# Patient Record
Sex: Male | Born: 1960 | Race: White | Hispanic: No | Smoking: Never smoker
Health system: Southern US, Community
[De-identification: ages and names within clinical notes are randomized; demographics above are authoritative.]

## PROBLEM LIST (undated history)

## (undated) DIAGNOSIS — T7840XA Allergy, unspecified, initial encounter: Secondary | ICD-10-CM

## (undated) DIAGNOSIS — R9430 Abnormal result of cardiovascular function study, unspecified: Secondary | ICD-10-CM

## (undated) DIAGNOSIS — I480 Paroxysmal atrial fibrillation: Secondary | ICD-10-CM

## (undated) HISTORY — DX: Allergy, unspecified, initial encounter: T78.40XA

## (undated) HISTORY — DX: Abnormal result of cardiovascular function study, unspecified: R94.30

---

## 1999-09-06 ENCOUNTER — Emergency Department (HOSPITAL_COMMUNITY): Admission: EM | Admit: 1999-09-06 | Discharge: 1999-09-06 | Payer: Self-pay | Admitting: Emergency Medicine

## 2012-03-18 ENCOUNTER — Ambulatory Visit: Payer: Self-pay | Admitting: Family Medicine

## 2012-03-18 VITALS — BP 128/76 | HR 92 | Temp 98.3°F | Resp 16 | Ht 72.5 in | Wt 183.0 lb

## 2012-03-18 DIAGNOSIS — Z0000 Encounter for general adult medical examination without abnormal findings: Secondary | ICD-10-CM

## 2012-03-18 DIAGNOSIS — M549 Dorsalgia, unspecified: Secondary | ICD-10-CM

## 2012-03-18 DIAGNOSIS — M545 Low back pain, unspecified: Secondary | ICD-10-CM

## 2012-03-18 DIAGNOSIS — J019 Acute sinusitis, unspecified: Secondary | ICD-10-CM

## 2012-03-18 DIAGNOSIS — J329 Chronic sinusitis, unspecified: Secondary | ICD-10-CM

## 2012-03-18 MED ORDER — AMOXICILLIN 875 MG PO TABS: 875.0000 mg | ORAL_TABLET | Freq: Two times a day (BID) | ORAL | Status: DC

## 2012-03-18 NOTE — Patient Instructions (Signed)
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

## 2012-03-18 NOTE — Progress Notes (Signed)
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  He has stretching exercises.  Doing saline irrigations.  The drainage is light green.  He has a h/o sinus polyp.  Objective:  NAD Nose:  Erythematous with some edema Oroph:  Clear TM's:  Normal Neck: supple  Back: nontender, no scoliosis seen  Assessment:  Sinus infection, chronic LBP  Plan: Refer to [employer]

## 2012-03-24 ENCOUNTER — Telehealth: Payer: Self-pay

## 2012-03-24 NOTE — Telephone Encounter (Signed)
[facility] STATES DR KURT REFERRED PT AND HE WILL BE COMING TO SEE THEM THIS AFTERNOON AND THEY HAVE NO RECORDS PLEASE FAX TO [phone] AND YOU MAY REACH THEM AT [phone]

## 2012-09-04 ENCOUNTER — Ambulatory Visit: Payer: Self-pay | Admitting: Emergency Medicine

## 2012-09-04 VITALS — BP 118/72 | HR 78 | Temp 98.4°F | Resp 16 | Ht 73.25 in | Wt 186.6 lb

## 2012-09-04 DIAGNOSIS — M7552 Bursitis of left shoulder: Secondary | ICD-10-CM

## 2012-09-04 DIAGNOSIS — M719 Bursopathy, unspecified: Secondary | ICD-10-CM

## 2012-09-04 MED ORDER — NAPROXEN SODIUM 550 MG PO TABS: 550.0000 mg | ORAL_TABLET | Freq: Two times a day (BID) | ORAL | Status: AC

## 2012-09-04 MED ORDER — METHYLPREDNISOLONE ACETATE 80 MG/ML IJ SUSP: 80.0000 mg | Freq: Once | INTRAMUSCULAR | Status: DC

## 2012-09-04 NOTE — Progress Notes (Signed)
[facility] [address] [place] [address] [phone]  Date:  09/04/2012   Name:  Luke Benitez   DOB:  19-Dec-1960   MRN:  [id number]  PCP:  No primary provider on file.    Chief Complaint: Shoulder Pain   History of Present Illness:  Herny Scurlock is a 52 y.o. very pleasant male patient who presents with the following:  Overuse injury from one week ago while weight lifting.  No direct injury.  Has pain in lateral shoulder posteriorly.  No crepitus.  No erythema.  Pain worst yesterday.  Worse with movement.  No improvement with over the counter medications or other home remedies. Denies other complaint or health concern today.   There are no active problems to display for this patient.   Past Medical History  Diagnosis Date  . Allergy     No past surgical history on file.  History  Substance Use Topics  . Smoking status: Never Smoker   . Smokeless tobacco: Not on file  . Alcohol Use: 2.4 oz/week    4 Cans of beer per week    Family History  Problem Relation Age of Onset  . Cancer Mother   . Cancer Father   . Stroke Maternal Grandmother   . Emphysema Maternal Grandfather   . Heart attack Paternal Grandfather     No Known Allergies  Medication list has been reviewed and updated.  Current Outpatient Prescriptions on File Prior to Visit  Medication Sig Dispense Refill  . amoxicillin (AMOXIL) 875 MG tablet Take 1 tablet (875 mg total) by mouth 2 (two) times daily.  20 tablet  0   No current facility-administered medications on file prior to visit.    Review of Systems:  As per HPI, otherwise negative.    Physical Examination: Filed Vitals:   09/04/12 1609  BP: 118/72  Pulse: 78  Temp: 98.4 F (36.9 C)  Resp: 16   Filed Vitals:   09/04/12 1609  Height: 6' 1.25" (1.861 m)  Weight: 186 lb 9.6 oz (84.641 kg)   Body mass index is 24.44 kg/(m^2). Ideal Body Weight: Weight in (lb) to have BMI = 25: 190.4   GEN: WDWN,  NAD, Non-toxic, Alert & Oriented x 3 HEENT: Atraumatic, Normocephalic.  Ears and Nose: No external deformity. EXTR: No clubbing/cyanosis/edema NEURO: Normal gait.  PSYCH: Normally interactive. Conversant. Not depressed or anxious appearing.  Calm demeanor.  SHOULDER:  Left posterior shoulder tender with no crepitus.  Full AROM  Assessment and Plan: Shoulder bursitis Anaprox Depo medrol   Signed,  Phillips Odor, MD

## 2012-09-04 NOTE — Patient Instructions (Addendum)
Bursitis Bursitis is a swelling and soreness (inflammation) of a fluid-filled sac (bursa) that overlies and protects a joint. It can be caused by injury, overuse of the joint, arthritis or infection. The joints most likely to be affected are the elbows, shoulders, hips and knees. HOME CARE INSTRUCTIONS   Apply ice to the affected area for 15 to 20 minutes each hour while awake for 2 days. Put the ice in a plastic bag and place a towel between the bag of ice and your skin.  Rest the injured joint as much as possible, but continue to put the joint through a full range of motion, 4 times per day. (The shoulder joint especially becomes rapidly "frozen" if not used.) When the pain lessens, begin normal slow movements and usual activities.  Only take over-the-counter or prescription medicines for pain, discomfort or fever as directed by your caregiver.  Your caregiver may recommend draining the bursa and injecting medicine into the bursa. This may help the healing process.  Follow all instructions for follow-up with your caregiver. This includes any orthopedic referrals, physical therapy and rehabilitation. Any delay in obtaining necessary care could result in a delay or failure of the bursitis to heal and chronic pain. SEEK IMMEDIATE MEDICAL CARE IF:   Your pain increases even during treatment.  You develop an oral temperature above 102 F (38.9 C) and have heat and inflammation over the involved bursa. MAKE SURE YOU:   Understand these instructions.  Will watch your condition.  Will get help right away if you are not doing well or get worse. Document Released: 04/06/2000 Document Revised: 07/02/2011 Document Reviewed: 03/11/2009 ExitCare Patient Information 2013 [employer].  

## 2014-03-12 ENCOUNTER — Encounter (HOSPITAL_COMMUNITY): Payer: Self-pay | Admitting: Emergency Medicine

## 2014-03-12 ENCOUNTER — Emergency Department (HOSPITAL_COMMUNITY)
Admission: EM | Admit: 2014-03-12 | Discharge: 2014-03-12 | Disposition: A | Discharge status: Home / Self Care | Payer: Self-pay | Attending: Emergency Medicine | Admitting: Emergency Medicine

## 2014-03-12 ENCOUNTER — Telehealth (HOSPITAL_BASED_OUTPATIENT_CLINIC_OR_DEPARTMENT_OTHER): Payer: Self-pay | Admitting: Emergency Medicine

## 2014-03-12 DIAGNOSIS — Z792 Long term (current) use of antibiotics: Secondary | ICD-10-CM | POA: Insufficient documentation

## 2014-03-12 DIAGNOSIS — I359 Nonrheumatic aortic valve disorder, unspecified: Secondary | ICD-10-CM

## 2014-03-12 DIAGNOSIS — I480 Paroxysmal atrial fibrillation: Secondary | ICD-10-CM | POA: Diagnosis present

## 2014-03-12 DIAGNOSIS — I4891 Unspecified atrial fibrillation: Secondary | ICD-10-CM | POA: Insufficient documentation

## 2014-03-12 LAB — CBC WITH DIFFERENTIAL/PLATELET
BASOS PCT: 0 % (ref 0–1)
Basophils Absolute: 0 10*3/uL (ref 0.0–0.1)
EOS PCT: 1 % (ref 0–5)
Eosinophils Absolute: 0.1 10*3/uL (ref 0.0–0.7)
HCT: 42.3 % (ref 39.0–52.0)
Hemoglobin: 14.4 g/dL (ref 13.0–17.0)
Lymphocytes Relative: 42 % (ref 12–46)
Lymphs Abs: 2.9 10*3/uL (ref 0.7–4.0)
MCH: 30.3 pg (ref 26.0–34.0)
MCHC: 34 g/dL (ref 30.0–36.0)
MCV: 88.9 fL (ref 78.0–100.0)
Monocytes Absolute: 0.6 10*3/uL (ref 0.1–1.0)
Monocytes Relative: 9 % (ref 3–12)
Neutro Abs: 3.3 10*3/uL (ref 1.7–7.7)
Neutrophils Relative %: 48 % (ref 43–77)
Platelets: 314 10*3/uL (ref 150–400)
RBC: 4.76 MIL/uL (ref 4.22–5.81)
RDW: 13 % (ref 11.5–15.5)
WBC: 6.8 10*3/uL (ref 4.0–10.5)

## 2014-03-12 LAB — BASIC METABOLIC PANEL
Anion gap: 17 — ABNORMAL HIGH (ref 5–15)
BUN: 16 mg/dL (ref 6–23)
CALCIUM: 9.1 mg/dL (ref 8.4–10.5)
CO2: 22 mEq/L (ref 19–32)
Chloride: 100 mEq/L (ref 96–112)
Creatinine, Ser: 0.95 mg/dL (ref 0.50–1.35)
GFR calc Af Amer: >90 mL/min (ref >90)
GLUCOSE: 153 mg/dL — AB (ref 70–99)
Potassium: 3.9 mEq/L (ref 3.7–5.3)
SODIUM: 139 meq/L (ref 137–147)

## 2014-03-12 MED ORDER — RIVAROXABAN 20 MG PO TABS: 20.0000 mg | ORAL_TABLET | Freq: Every day | ORAL | Status: DC

## 2014-03-12 MED ORDER — DILTIAZEM HCL 100 MG IV SOLR
5.0000 mg/h | Freq: Once | INTRAVENOUS | Status: AC
  Administered 2014-03-12: 5 mg/h via INTRAVENOUS
  Administered 2014-03-12: 15 mg/h via INTRAVENOUS

## 2014-03-12 MED ORDER — DILTIAZEM HCL 60 MG PO TABS
60.0000 mg | ORAL_TABLET | Freq: Four times a day (QID) | ORAL | Status: AC
  Administered 2014-03-12: 60 mg via ORAL
  Filled 2014-03-12: qty 1

## 2014-03-12 MED ORDER — RIVAROXABAN 20 MG PO TABS
20.0000 mg | ORAL_TABLET | Freq: Once | ORAL | Status: AC
  Administered 2014-03-12: 20 mg via ORAL
  Filled 2014-03-12: qty 1

## 2014-03-12 MED ORDER — DILTIAZEM HCL ER COATED BEADS 180 MG PO TB24: 180.0000 mg | ORAL_TABLET | Freq: Every day | ORAL | Status: DC

## 2014-03-12 NOTE — Discharge Instructions (Signed)

## 2014-03-12 NOTE — ED Notes (Signed)
Pt converted to NSR HR 70

## 2014-03-12 NOTE — Progress Notes (Signed)
  CARE MANAGEMENT ED NOTE 03/12/2014  Patient:  Luke Benitez,Luke Benitez   Account Number:  [id number]  Date Initiated:  03/12/2014  Documentation initiated by:  [facility]Kyriana Yankee  Subjective/Objective Assessment:     Subjective/Objective Assessment Detail:   [facility] Complains of palpitations onset 1 PM today Felt his rapid heartbeat. No other associated symptoms no chest pain no shortness of breath no nausea or vomiting.     Action/Plan:   Medications assistance:  Xarelto 30 day savings Card  Drug coupon for Cardizem   Action/Plan Detail:   Anticipated DC Date:  03/12/2014     Status Recommendation to Physician:   Result of Recommendation:  Agreed    DC Planning Services  Medication Assistance    Choice offered to / List presented to:  C-1 Patient          Status of service:  Completed, signed off  ED Comments:   ED Comments Detail:  ED CM received call from patient today regarding medication assistance for xarelto and cardizem. Explained that we could assist him with a 30 free xarelto savings card, and cardizem coupon. Patient was agreeable. Offered to assist with f/u care. patient states he has a cardiologist that he will f/u with on Monday. Patient agrees to come back to the ED to pick up savings card and coupon. No further CM needs identified.

## 2014-03-12 NOTE — ED Notes (Signed)
Patient transported to echo ?

## 2014-03-12 NOTE — ED Notes (Signed)
Approximately 1 hour ago patient reports he bean having heart palpitations. Patient was experiencing mild SOB and anxiety with symptoms. EMS arrived and patient was in a-fib at 120-160bpm. EMS gave 20 of Cardizem and patients rate is now 90-120bpm. Denies CP, no prior hx of a-fib.

## 2014-03-12 NOTE — ED Notes (Signed)
Cardiology at bedside.

## 2014-03-12 NOTE — ED Provider Notes (Addendum)
CSN: [id number]     Arrival date & time 03/12/14  0158 History   First MD Initiated Contact with Patient 03/12/14 0251     Chief Complaint  Patient presents with  . Atrial Fibrillation     (Consider location/radiation/quality/duration/timing/severity/associated sxs/prior Treatment) HPI Complains of palpitations onset 1 PM today Felt his rapid heartbeat. No other associated symptoms no chest pain no shortness of breath no nausea or vomiting. Patient brought by EMS treated with Cardizem 20 g IV prior to arrival which temporary slowed his heart beat. He is found by EMS to be in atrial fibrillation with rapid ventricular response. He admits to drinking 4 beers earlier tonight. He normally drinks 1-2 beers daily. Past Medical History  Diagnosis Date  . Allergy    History reviewed. No pertinent past surgical history. Family History  Problem Relation Age of Onset  . Cancer Mother   . Cancer Father   . Stroke Maternal Grandmother   . Emphysema Maternal Grandfather   . Heart attack Paternal Grandfather    History  Substance Use Topics  . Smoking status: Never Smoker   . Smokeless tobacco: Not on file  . Alcohol Use: 2.4 oz/week    4 Cans of beer per week    Review of Systems  Constitutional: Negative.   HENT: Negative.   Respiratory: Negative.   Cardiovascular: Positive for palpitations.  Gastrointestinal: Negative.   Musculoskeletal: Negative.   Skin: Negative.   Neurological: Negative.   Psychiatric/Behavioral: Negative.   All other systems reviewed and are negative.     Allergies  Review of patient's allergies indicates no known allergies.  Home Medications   Prior to Admission medications   Medication Sig Start Date End Date Taking? Authorizing Provider  amoxicillin (AMOXIL) 875 MG tablet Take 1 tablet (875 mg total) by mouth 2 (two) times daily. 03/18/12   Elvina SidleKurt Lauenstein, MD   BP 115/67 mmHg  Pulse 109  Temp(Src) 98 F (36.7 C) (Oral)  Resp 11  SpO2  95% Physical Exam  Constitutional: He appears well-developed and well-nourished.  HENT:  Head: Normocephalic and atraumatic.  Eyes: Conjunctivae are normal. Pupils are equal, round, and reactive to light.  Neck: Neck supple. No tracheal deviation present. No thyromegaly present.  Cardiovascular:  No murmur heard. Tachycardic irregularly irregular  Pulmonary/Chest: Effort normal and breath sounds normal.  Abdominal: Soft. Bowel sounds are normal. He exhibits no distension. There is no tenderness.  Musculoskeletal: Normal range of motion. He exhibits no edema or tenderness.  Neurological: He is alert. Coordination normal.  Skin: Skin is warm and dry. No rash noted.  Psychiatric: He has a normal mood and affect.  Nursing note and vitals reviewed.   ED Course  Procedures (including critical care time) Labs Review Labs Reviewed - No data to display  Imaging Review No results found.   EKG Interpretation None      Date: 03/12/2014  Rate: 125  Rhythm: atrial fibrillation  QRS Axis: left  Intervals: normal  ST/T Wave abnormalities: nonspecific T wave changes  Conduction Disutrbances:left anterior fascicular block  Narrative Interpretation:   Old EKG Reviewed: none available 6 AM patient resting comfortably after treatment with intravenous Cardizem drip. He is now rate controlled Results for orders placed or performed during the hospital encounter of 03/12/14  Basic metabolic panel  Result Value Ref Range   Sodium 139 137 - 147 mEq/L   Potassium 3.9 3.7 - 5.3 mEq/L   Chloride 100 96 - 112 mEq/L   CO2 22 19 -  32 mEq/L   Glucose, Bld 153 (H) 70 - 99 mg/dL   BUN 16 6 - 23 mg/dL   Creatinine, Ser 1.610.95 0.50 - 1.35 mg/dL   Calcium 9.1 8.4 - 09.610.5 mg/dL   GFR calc non Af Amer >90 >90 mL/min   GFR calc Af Amer >90 >90 mL/min   Anion gap 17 (H) 5 - 15  CBC with Differential  Result Value Ref Range   WBC 6.8 4.0 - 10.5 K/uL   RBC 4.76 4.22 - 5.81 MIL/uL   Hemoglobin 14.4 13.0 -  17.0 g/dL   HCT 04.542.3 40.939.0 - 81.152.0 %   MCV 88.9 78.0 - 100.0 fL   MCH 30.3 26.0 - 34.0 pg   MCHC 34.0 30.0 - 36.0 g/dL   RDW 91.413.0 78.211.5 - 95.615.5 %   Platelets 314 150 - 400 K/uL   Neutrophils Relative % 48 43 - 77 %   Neutro Abs 3.3 1.7 - 7.7 K/uL   Lymphocytes Relative 42 12 - 46 %   Lymphs Abs 2.9 0.7 - 4.0 K/uL   Monocytes Relative 9 3 - 12 %   Monocytes Absolute 0.6 0.1 - 1.0 K/uL   Eosinophils Relative 1 0 - 5 %   Eosinophils Absolute 0.1 0.0 - 0.7 K/uL   Basophils Relative 0 0 - 1 %   Basophils Absolute 0.0 0.0 - 0.1 K/uL   No results found.  MDM  Spoke with cardiologist on call. Echocardiogram ordered at his request. Cardiology service will come to evaluate patientin the ed . Patient will need out patient follow-up and referral to primary care physician 4 hyperglycemia Diagnoses #1 atrial fibrillation with rapid ventricular response #2 hyperglycemia Final diagnoses:  None        Doug SouSam Bayler Nehring, MD 03/12/14 [phone] Pt signed out to Dr. Hyacinth MeekerMiller at 815 am pending cardiology consult. Patient will need referral to primary care physician for  hyperglycemia  Doug SouSam Kaliyan Osbourn, MD 03/12/14 [phone]

## 2014-03-12 NOTE — Consult Note (Signed)
CARDIOLOGY CONSULT NOTE   Patient ID: Luke Benitez MRN: [id number] DOB/AGE: March 17, 1961 53 y.o.  Admit Date: 03/12/2014  Primary Physician: No PCP Per Patient   Primary cardiologist      New   Lucyle Alumbaugh  Clinical Summary Luke Benitez is a 53 y.o.male. He presented to the emergency room this morning around 1 AM with rapid atrial fibrillation. After careful questioning he does remember having some palpitations previously over time. He drinks one or 2 beers a day. Last evening he had a total of 4 beers over a prolonged period of time. He did not feel intoxicated. He developed palpitations and was brought to the emergency room. His EKG reveals rapid atrial fibrillation. He has received IV diltiazem and his rate has been slowing. He feels fine with his rate controlled. He has not had any significant chest pain.  The patient has had a 2-D echo this morning. It shows normal left ventricular function. There are no significant valvular abnormalities.   No Known Allergies  Medications Scheduled Medications: . diltiazem  60 mg Oral 4 times per day     Infusions:     PRN Medications:     Past Medical History  Diagnosis Date  . Allergy    he does not have any history of cardiac or GI problems. He has been overweight in the past and has lost weight.  History reviewed. No pertinent past surgical history.  Family History  Problem Relation Age of Onset  . Cancer Mother   . Cancer Father   . Stroke Maternal Grandmother   . Emphysema Maternal Grandfather   . Heart attack Paternal Grandfather     Social History Luke Benitez reports that he has never smoked. He does not have any smokeless tobacco history on file. Luke Benitez reports that he drinks about 2.4 oz of alcohol per week.  Review of Systems Patient denies fever, chills, headache, sweats, rash, change in vision, change in hearing, chest pain, cough, nausea or vomiting, urinary symptoms. All other systems are reviewed  and are negative.  Physical Examination Blood pressure 116/62, pulse 68, temperature 98 F (36.7 C), temperature source Oral, resp. rate 21, SpO2 96 %.  Intake/Output Summary (Last 24 hours) at 03/12/14 1257 Last data filed at 03/12/14 0810  Gross per 24 hour  Intake      0 ml  Output   1125 ml  Net  -1125 ml   The patient is stable. His mother and father are in the room. Later his brother came into the room. I've spoken with all of them. Head is atraumatic. Sclera and conjunctiva are normal. There is no jugulovenous distention. Lungs are clear. Respiratory effort is not labored. Cardiac exam reveals S1 and S2. The abdomen is soft. There is no peripheral edema. There are no musculoskeletal deformities. There are no skin rashes.  Prior Cardiac Testing/Procedures  Lab Results  Basic Metabolic Panel:  Recent Labs Lab 03/12/14 0309  NA 139  K 3.9  CL 100  CO2 22  GLUCOSE 153*  BUN 16  CREATININE 0.95  CALCIUM 9.1    Liver Function Tests: No results for input(s): AST, ALT, ALKPHOS, BILITOT, PROT, ALBUMIN in the last 168 hours.  CBC:  Recent Labs Lab 03/12/14 0309  WBC 6.8  NEUTROABS 3.3  HGB 14.4  HCT 42.3  MCV 88.9  PLT 314    Cardiac Enzymes: No results for input(s): CKTOTAL, CKMB, CKMBINDEX, TROPONINI in the last 168 hours.  BNP: Invalid input(s): POCBNP  Radiology: No results found.   ECG: I have reviewed his EKGs. There is atrial fibrillation. Initially there was a rapid rate. Eventually this slowed.  I have reviewed telemetry today March 12, 2014. There is atrial fibrillation. The rate is now well controlled.   Impression and Recommendations  Atrial fibrillation with rapid ventricular response    The patient feels much better at this time. I've had a careful lengthy discussion with the patient and his family. He does note that he has had some palpitations before. With this in mind, I cannot be absolutely sure when his atrial fibrillation  started. Therefore it would not be appropriate to proceed with cardioversion at this time. Consideration could be given to anticoagulation for 36 hours to be followed by TEE cardioversion. This would entail keeping the patient in the hospital. He prefers not to do this. Also it does not appear to be necessary. I feel that it will be safe to use oral diltiazem for continued rate control. He had a very good response to IV diltiazem. Oral anticoagulation will be started today and he will continue it at home. I will then plan to see him in my office on Wednesday, November 25. At that time we will see if he remains in atrial fibrillation. Based on his rhythm at that time I will make further decisions about further workup and therapy. It is reassuring to know that he has normal left ventricular function and normal valvular function.  We will plan for the patient to go home on long-acting diltiazem 180 mg daily. I will instruct him to take his first dose this evening, 6 hours from the timing of the 60 mg dose of short acting diltiazem that he is currently receiving in the emergency room.  Jerral BonitoJeff Yailene Badia, MD Signed:  03/12/2014, 12:57 PM

## 2014-03-12 NOTE — Progress Notes (Signed)
  Echocardiogram 2D Echocardiogram has been performed.  Cathie BeamsGREGORY, Kalana Yust 03/12/2014, 8:53 AM

## 2014-03-12 NOTE — Progress Notes (Signed)
ANTICOAGULATION CONSULT NOTE - Initial Consult  Pharmacy Consult for Xarelto Indication: atrial fibrillation  No Known Allergies  Patient Measurements:    Vital Signs: Temp: 98 F (36.7 C) (11/20 0217) Temp Source: Oral (11/20 0217) BP: 105/67 mmHg (11/20 1336) Pulse Rate: 69 (11/20 1336)  Labs:  Recent Labs  03/12/14 0309  HGB 14.4  HCT 42.3  PLT 314  CREATININE 0.95    CrCl cannot be calculated (Unknown ideal weight.).   Medical History: Past Medical History  Diagnosis Date  . Allergy     Medications:  Scheduled:  . diltiazem  60 mg Oral 4 times per day  . rivaroxaban  20 mg Oral Once    Assessment: 53 yo m who presented to the ED on 11/20 for palpitations.  Patient noted to be in atrial fibrillation.  Pharmacy is consulted to begin Xarelto.  Hgb 14.4, plts 314, no bleeding noted. No AC PTA.  Goal of Therapy:  Monitor platelets by anticoagulation protocol: Yes   Plan:  Xarelto 20 mg PO daily with evening meal Education completed Monitor for bleeding  Cassie L. Roseanne RenoStewart, PharmD Clinical Pharmacy Resident Pager: [phone] 03/12/2014 2:03 PM

## 2014-03-16 ENCOUNTER — Encounter: Payer: Self-pay | Admitting: Cardiology

## 2014-03-16 DIAGNOSIS — R9430 Abnormal result of cardiovascular function study, unspecified: Secondary | ICD-10-CM | POA: Insufficient documentation

## 2014-03-17 ENCOUNTER — Encounter: Payer: Self-pay | Admitting: Cardiology

## 2014-03-17 ENCOUNTER — Ambulatory Visit (INDEPENDENT_AMBULATORY_CARE_PROVIDER_SITE_OTHER): Payer: Self-pay | Admitting: Cardiology

## 2014-03-17 VITALS — BP 128/80 | HR 68 | Ht 73.25 in | Wt 191.0 lb

## 2014-03-17 DIAGNOSIS — R072 Precordial pain: Secondary | ICD-10-CM

## 2014-03-17 DIAGNOSIS — R0989 Other specified symptoms and signs involving the circulatory and respiratory systems: Secondary | ICD-10-CM

## 2014-03-17 DIAGNOSIS — R9431 Abnormal electrocardiogram [ECG] [EKG]: Secondary | ICD-10-CM

## 2014-03-17 DIAGNOSIS — IMO0002 Reserved for concepts with insufficient information to code with codable children: Secondary | ICD-10-CM

## 2014-03-17 DIAGNOSIS — R9430 Abnormal result of cardiovascular function study, unspecified: Secondary | ICD-10-CM

## 2014-03-17 DIAGNOSIS — I480 Paroxysmal atrial fibrillation: Secondary | ICD-10-CM

## 2014-03-17 NOTE — Progress Notes (Signed)
Patient ID: Luke Benitez, male   DOB: 1961/04/02, 53 y.o.   MRN: [id number]    HPI The patient is seen today to follow-up atrial fibrillation. I had seen him in the emergency room on March 12, 2014. He had come into the emergency room early that morning with rapid atrial fibrillation. There is a history of excess caffeine intake. Also he had 4 beers over a long period of time that evening. He thinks he was not intoxicated. His atrial fib rate responded rapidly to IV Cardizem in the emergency room. He had a two-dimensional echo. It showed normal left ventricular function. There were no significant valvular abnormalities. I decided that it would be safe for him to go home from the emergency room. He was switched to oral Cardizem and Xarelto was started. He was scheduled to see me today in the office. I was not aware, but shortly before leaving the emergency room he had a follow-up EKG showing that he had converted to sinus rhythm at that time. He does not feel that he has had any significant palpitations since that time. He did have some slight precordial chest discomfort when he had rapid heart rate.  No Known Allergies  No current outpatient prescriptions on file.   Current Facility-Administered Medications  Medication Dose Route Frequency Provider Last Rate Last Dose  . methylPREDNISolone acetate (DEPO-MEDROL) injection 80 mg  80 mg Intramuscular Once Carmelina DaneJeffery S Anderson, MD        History   Social History  . Marital Status: Divorced    Spouse Name: N/A    Number of Children: N/A  . Years of Education: N/A   Occupational History  . Not on file.   Social History Main Topics  . Smoking status: Never Smoker   . Smokeless tobacco: Not on file  . Alcohol Use: 2.4 oz/week    4 Cans of beer per week  . Drug Use: No  . Sexual Activity: No   Other Topics Concern  . Not on file   Social History Narrative    Family History  Problem Relation Age of Onset  . Cancer Mother   .  Cancer Father   . Stroke Maternal Grandmother   . Emphysema Maternal Grandfather   . Heart attack Paternal Grandfather     Past Medical History  Diagnosis Date  . Allergy   . Ejection fraction     History reviewed. No pertinent past surgical history.  Patient Active Problem List   Diagnosis Date Noted  . Ejection fraction   . Paroxysmal atrial fibrillation 03/12/2014    ROS  Patient denies fever, chills, headache, sweats, rash, change in vision, change in hearing, cough, nausea or vomiting, urinary symptoms. All other systems are reviewed and are negative.  PHYSICAL EXAM The patient looks fine today. He is oriented to person time and place. Affect is normal. Head is atraumatic. Sclera and conjunctiva are normal. There is no jugular venous distention. Lungs are clear. Respiratory effort is nonlabored. Cardiac exam reveals S1 and S2. There are no clicks or significant murmurs. The abdomen is soft. There is no peripheral edema. There are no musculoskeletal deformities. There are no skin rashes.  Filed Vitals:   03/17/14 1040  BP: 128/80  Pulse: 68  Height: 6' 1.25" (1.861 m)  Weight: 191 lb (86.637 kg)   EKG was done today and reviewed by me. He is holding sinus rhythm. He has slight incomplete right bundle branch block with slight J-point elevation in V1 and V2.  This was seen in the emergency room EKGs. There is a tiny Q wave in V2. No significant change from hospital EKGs.  ASSESSMENT & PLAN

## 2014-03-17 NOTE — Assessment & Plan Note (Signed)
The patient has a mild abnormality in his EKG. He did have some chest pressure with his rapid atrial fibrillation. We will proceed with a stress echo for full assessment.

## 2014-03-17 NOTE — Patient Instructions (Addendum)
Your physician has recommended you make the following change in your medication:   STOP TAKING XARELTO NOW  STOP TAKING CARDIZEM NOW   Your physician has requested that you have a stress echocardiogram. For further information please visit [email or web address]. Please follow instruction sheet as given.  Your physician recommends that you return for lab work in: March 26, 2014 TO CHECK TSH AND FASTING LIPIDS   Your physician wants you to follow-up in: 6 MONTHS WITH DR Chancy HurterKATZ You will receive a reminder letter in the mail two months in advance. If you don't receive a letter, please call our office to schedule the follow-up appointment.

## 2014-03-17 NOTE — Assessment & Plan Note (Signed)
His resting ejection fraction is normal by echo. No further workup.

## 2014-03-17 NOTE — Assessment & Plan Note (Signed)
The patient had atrial fibrillation with rapid ventricular rate on March 12, 2014. History relates that he has had increased stress. He previously had excess caffeine intake. He has cut back significantly. He also had moderate alcohol intake on the evening of the event. There is no significant valvular disease. TSH is being ordered. I had a long discussion with the patient about his atrial fib. Now that he is in sinus rhythm the Cardizem can be stopped. His atrial fibrillation risk score is very low. I had started Xarelto for the possibility that we would needed to do a cardioversion. However he is holding sinus rhythm. Xarelto is to be stopped. He can go about usual activities. I will be in touch with him concerning the result of his stress echo. I will see him back in 6 months in follow-up. TSH and lipid studies will be obtained.  As part of today's evaluation I spent greater than 25 minutes with his total care. More than half of that time was with direct contact with him. We had a very lengthy discussion about atrial fib in his case.

## 2014-03-26 ENCOUNTER — Ambulatory Visit (HOSPITAL_COMMUNITY): Payer: Self-pay | Attending: Cardiology | Admitting: Radiology

## 2014-03-26 DIAGNOSIS — I480 Paroxysmal atrial fibrillation: Secondary | ICD-10-CM

## 2014-03-26 DIAGNOSIS — R072 Precordial pain: Secondary | ICD-10-CM | POA: Insufficient documentation

## 2014-03-26 DIAGNOSIS — I4891 Unspecified atrial fibrillation: Secondary | ICD-10-CM | POA: Insufficient documentation

## 2014-03-26 NOTE — Progress Notes (Signed)
Stress Echocardiogram performed.  

## 2014-04-01 ENCOUNTER — Other Ambulatory Visit: Payer: Self-pay

## 2014-04-01 DIAGNOSIS — Z13220 Encounter for screening for lipoid disorders: Secondary | ICD-10-CM

## 2014-04-09 ENCOUNTER — Other Ambulatory Visit: Payer: Self-pay

## 2014-08-20 ENCOUNTER — Ambulatory Visit (INDEPENDENT_AMBULATORY_CARE_PROVIDER_SITE_OTHER): Payer: Self-pay | Admitting: Family Medicine

## 2014-08-20 VITALS — BP 118/68 | HR 92 | Temp 98.6°F | Resp 17 | Ht 72.0 in | Wt 197.0 lb

## 2014-08-20 DIAGNOSIS — R0981 Nasal congestion: Secondary | ICD-10-CM

## 2014-08-20 DIAGNOSIS — M25512 Pain in left shoulder: Secondary | ICD-10-CM

## 2014-08-20 MED ORDER — PREDNISONE 10 MG PO TABS: ORAL_TABLET | ORAL | Status: DC

## 2014-08-20 NOTE — Patient Instructions (Addendum)
[facility]   Impingement Syndrome, Rotator Cuff, Bursitis with Rehab Impingement syndrome is a condition that involves inflammation of the tendons of the rotator cuff and the subacromial bursa, that causes pain in the shoulder. The rotator cuff consists of four tendons and muscles that control much of the shoulder and upper arm function. The subacromial bursa is a fluid filled sac that helps reduce friction between the rotator cuff and one of the bones of the shoulder (acromion). Impingement syndrome is usually an overuse injury that causes swelling of the bursa (bursitis), swelling of the tendon (tendonitis), and/or a tear of the tendon (strain). Strains are classified into three categories. Grade 1 strains cause pain, but the tendon is not lengthened. Grade 2 strains include a lengthened ligament, due to the ligament being stretched or partially ruptured. With grade 2 strains there is still function, although the function may be decreased. Grade 3 strains include a complete tear of the tendon or muscle, and function is usually impaired. SYMPTOMS   Pain around the shoulder, often at the outer portion of the upper arm.  Pain that gets worse with shoulder function, especially when reaching overhead or lifting.  Sometimes, aching when not using the arm.  Pain that wakes you up at night.  Sometimes, tenderness, swelling, warmth, or redness over the affected area.  Loss of strength.  Limited motion of the shoulder, especially reaching behind the back (to the back pocket or to unhook bra) or across your body.  Crackling sound (crepitation) when moving the arm.  Biceps tendon pain and inflammation (in the front of the shoulder). Worse when bending the elbow or lifting. CAUSES  Impingement syndrome is often an overuse injury, in which chronic (repetitive) motions cause the tendons or bursa to become inflamed. A strain occurs when a force is paced on the tendon or muscle that is greater than it can  withstand. Common mechanisms of injury include: Stress from sudden increase in duration, frequency, or intensity of training.  Direct hit (trauma) to the shoulder.  Aging, erosion of the tendon with normal use.  Bony bump on shoulder (acromial spur). RISK INCREASES WITH:  Contact sports (football, wrestling, boxing).  Throwing sports (baseball, tennis, volleyball).  Weightlifting and bodybuilding.  Heavy labor.  Previous injury to the rotator cuff, including impingement.  Poor shoulder strength and flexibility.  Failure to warm up properly before activity.  Inadequate protective equipment.  Old age.  Bony bump on shoulder (acromial spur). PREVENTION   Warm up and stretch properly before activity.  Allow for adequate recovery between workouts.  Maintain physical fitness:  Strength, flexibility, and endurance.  Cardiovascular fitness.  Learn and use proper exercise technique. PROGNOSIS  If treated properly, impingement syndrome usually goes away within 6 weeks. Sometimes surgery is required.  RELATED COMPLICATIONS   Longer healing time if not properly treated, or if not given enough time to heal.  Recurring symptoms, that result in a chronic condition.  Shoulder stiffness, frozen shoulder, or loss of motion.  Rotator cuff tendon tear.  Recurring symptoms, especially if activity is resumed too soon, with overuse, with a direct blow, or when using poor technique. TREATMENT  Treatment first involves the use of ice and medicine, to reduce pain and inflammation. The use of strengthening and stretching exercises may help reduce pain with activity. These exercises may be performed at home or with a therapist. If non-surgical treatment is unsuccessful after more than 6 months, surgery may be advised. After surgery and rehabilitation, activity is usually possible  in 3 months.  MEDICATION  If pain medicine is needed, nonsteroidal anti-inflammatory medicines (aspirin and  ibuprofen), or other minor pain relievers (acetaminophen), are often advised.  Do not take pain medicine for 7 days before surgery.  Prescription pain relievers may be given, if your caregiver thinks they are needed. Use only as directed and only as much as you need.  Corticosteroid injections may be given by your caregiver. These injections should be reserved for the most serious cases, because they may only be given a certain number of times. HEAT AND COLD  Cold treatment (icing) should be applied for 10 to 15 minutes every 2 to 3 hours for inflammation and pain, and immediately after activity that aggravates your symptoms. Use ice packs or an ice massage.  Heat treatment may be used before performing stretching and strengthening activities prescribed by your caregiver, physical therapist, or athletic trainer. Use a heat pack or a warm water soak. SEEK MEDICAL CARE IF:   Symptoms get worse or do not improve in 4 to 6 weeks, despite treatment.  New, unexplained symptoms develop. (Drugs used in treatment may produce side effects.) EXERCISES  RANGE OF MOTION (ROM) AND STRETCHING EXERCISES - Impingement Syndrome (Rotator Cuff  Tendinitis, Bursitis) These exercises may help you when beginning to rehabilitate your injury. Your symptoms may go away with or without further involvement from your physician, physical therapist or athletic trainer. While completing these exercises, remember:   Restoring tissue flexibility helps normal motion to return to the joints. This allows healthier, less painful movement and activity.  An effective stretch should be held for at least 30 seconds.  A stretch should never be painful. You should only feel a gentle lengthening or release in the stretched tissue. STRETCH - Flexion, Standing  Stand with good posture. With an underhand grip on your right / left hand, and an overhand grip on the opposite hand, grasp a broomstick or cane so that your hands are a  little more than shoulder width apart.  Keeping your right / left elbow straight and shoulder muscles relaxed, push the stick with your opposite hand, to raise your right / left arm in front of your body and then overhead. Raise your arm until you feel a stretch in your right / left shoulder, but before you have increased shoulder pain.  Try to avoid shrugging your right / left shoulder as your arm rises, by keeping your shoulder blade tucked down and toward your mid-back spine. Hold for __________ seconds.  Slowly return to the starting position. Repeat __________ times. Complete this exercise __________ times per day. STRETCH - Abduction, Supine  Lie on your back. With an underhand grip on your right / left hand and an overhand grip on the opposite hand, grasp a broomstick or cane so that your hands are a little more than shoulder width apart.  Keeping your right / left elbow straight and your shoulder muscles relaxed, push the stick with your opposite hand, to raise your right / left arm out to the side of your body and then overhead. Raise your arm until you feel a stretch in your right / left shoulder, but before you have increased shoulder pain.  Try to avoid shrugging your right / left shoulder as your arm rises, by keeping your shoulder blade tucked down and toward your mid-back spine. Hold for __________ seconds.  Slowly return to the starting position. Repeat __________ times. Complete this exercise __________ times per day. ROM - Flexion, Active-Assisted  Lie on your back. You may bend your knees for comfort.  Grasp a broomstick or cane so your hands are about shoulder width apart. Your right / left hand should grip the end of the stick, so that your hand is positioned "thumbs-up," as if you were about to shake hands.  Using your healthy arm to lead, raise your right / left arm overhead, until you feel a gentle stretch in your shoulder. Hold for __________ seconds.  Use the stick  to assist in returning your right / left arm to its starting position. Repeat __________ times. Complete this exercise __________ times per day.  ROM - Internal Rotation, Supine   Lie on your back on a firm surface. Place your right / left elbow about 60 degrees away from your side. Elevate your elbow with a folded towel, so that the elbow and shoulder are the same height.  Using a broomstick or cane and your strong arm, pull your right / left hand toward your body until you feel a gentle stretch, but no increase in your shoulder pain. Keep your shoulder and elbow in place throughout the exercise.  Hold for __________ seconds. Slowly return to the starting position. Repeat __________ times. Complete this exercise __________ times per day. STRETCH - Internal Rotation  Place your right / left hand behind your back, palm up.  Throw a towel or belt over your opposite shoulder. Grasp the towel with your right / left hand.  While keeping an upright posture, gently pull up on the towel, until you feel a stretch in the front of your right / left shoulder.  Avoid shrugging your right / left shoulder as your arm rises, by keeping your shoulder blade tucked down and toward your mid-back spine.  Hold for __________ seconds. Release the stretch, by lowering your healthy hand. Repeat __________ times. Complete this exercise __________ times per day. ROM - Internal Rotation   Using an underhand grip, grasp a stick behind your back with both hands.  While standing upright with good posture, slide the stick up your back until you feel a mild stretch in the front of your shoulder.  Hold for __________ seconds. Slowly return to your starting position. Repeat __________ times. Complete this exercise __________ times per day.  STRETCH - Posterior Shoulder Capsule   Stand or sit with good posture. Grasp your right / left elbow and draw it across your chest, keeping it at the same height as your  shoulder.  Pull your elbow, so your upper arm comes in closer to your chest. Pull until you feel a gentle stretch in the back of your shoulder.  Hold for __________ seconds. Repeat __________ times. Complete this exercise __________ times per day. STRENGTHENING EXERCISES - Impingement Syndrome (Rotator Cuff Tendinitis, Bursitis) These exercises may help you when beginning to rehabilitate your injury. They may resolve your symptoms with or without further involvement from your physician, physical therapist or athletic trainer. While completing these exercises, remember:  Muscles can gain both the endurance and the strength needed for everyday activities through controlled exercises.  Complete these exercises as instructed by your physician, physical therapist or athletic trainer. Increase the resistance and repetitions only as guided.  You may experience muscle soreness or fatigue, but the pain or discomfort you are trying to eliminate should never worsen during these exercises. If this pain does get worse, stop and make sure you are following the directions exactly. If the pain is still present after adjustments, discontinue the exercise  until you can discuss the trouble with your clinician.  During your recovery, avoid activity or exercises which involve actions that place your injured hand or elbow above your head or behind your back or head. These positions stress the tissues which you are trying to heal. STRENGTH - Scapular Depression and Adduction   With good posture, sit on a firm chair. Support your arms in front of you, with pillows, arm rests, or on a table top. Have your elbows in line with the sides of your body.  Gently draw your shoulder blades down and toward your mid-back spine. Gradually increase the tension, without tensing the muscles along the top of your shoulders and the back of your neck.  Hold for __________ seconds. Slowly release the tension and relax your muscles  completely before starting the next repetition.  After you have practiced this exercise, remove the arm support and complete the exercise in standing as well as sitting position. Repeat __________ times. Complete this exercise __________ times per day.  STRENGTH - Shoulder Abductors, Isometric  With good posture, stand or sit about 4-6 inches from a wall, with your right / left side facing the wall.  Bend your right / left elbow. Gently press your right / left elbow into the wall. Increase the pressure gradually, until you are pressing as hard as you can, without shrugging your shoulder or increasing any shoulder discomfort.  Hold for __________ seconds.  Release the tension slowly. Relax your shoulder muscles completely before you begin the next repetition. Repeat __________ times. Complete this exercise __________ times per day.  STRENGTH - External Rotators, Isometric  Keep your right / left elbow at your side and bend it 90 degrees.  Step into a door frame so that the outside of your right / left wrist can press against the door frame without your upper arm leaving your side.  Gently press your right / left wrist into the door frame, as if you were trying to swing the back of your hand away from your stomach. Gradually increase the tension, until you are pressing as hard as you can, without shrugging your shoulder or increasing any shoulder discomfort.  Hold for __________ seconds.  Release the tension slowly. Relax your shoulder muscles completely before you begin the next repetition. Repeat __________ times. Complete this exercise __________ times per day.  STRENGTH - Supraspinatus   Stand or sit with good posture. Grasp a __________ weight, or an exercise band or tubing, so that your hand is "thumbs-up," like you are shaking hands.  Slowly lift your right / left arm in a "V" away from your thigh, diagonally into the space between your side and straight ahead. Lift your hand to  shoulder height or as far as you can, without increasing any shoulder pain. At first, many people do not lift their hands above shoulder height.  Avoid shrugging your right / left shoulder as your arm rises, by keeping your shoulder blade tucked down and toward your mid-back spine.  Hold for __________ seconds. Control the descent of your hand, as you slowly return to your starting position. Repeat __________ times. Complete this exercise __________ times per day.  STRENGTH - External Rotators  Secure a rubber exercise band or tubing to a fixed object (table, pole) so that it is at the same height as your right / left elbow when you are standing or sitting on a firm surface.  Stand or sit so that the secured exercise band is at your  uninjured side.  Bend your right / left elbow 90 degrees. Place a folded towel or small pillow under your right / left arm, so that your elbow is a few inches away from your side.  Keeping the tension on the exercise band, pull it away from your body, as if pivoting on your elbow. Be sure to keep your body steady, so that the movement is coming only from your rotating shoulder.  Hold for __________ seconds. Release the tension in a controlled manner, as you return to the starting position. Repeat __________ times. Complete this exercise __________ times per day.  STRENGTH - Internal Rotators   Secure a rubber exercise band or tubing to a fixed object (table, pole) so that it is at the same height as your right / left elbow when you are standing or sitting on a firm surface.  Stand or sit so that the secured exercise band is at your right / left side.  Bend your elbow 90 degrees. Place a folded towel or small pillow under your right / left arm so that your elbow is a few inches away from your side.  Keeping the tension on the exercise band, pull it across your body, toward your stomach. Be sure to keep your body steady, so that the movement is coming only from  your rotating shoulder.  Hold for __________ seconds. Release the tension in a controlled manner, as you return to the starting position. Repeat __________ times. Complete this exercise __________ times per day.  STRENGTH - Scapular Protractors, Standing   Stand arms length away from a wall. Place your hands on the wall, keeping your elbows straight.  Begin by dropping your shoulder blades down and toward your mid-back spine.  To strengthen your protractors, keep your shoulder blades down, but slide them forward on your rib cage. It will feel as if you are lifting the back of your rib cage away from the wall. This is a subtle motion and can be challenging to complete. Ask your caregiver for further instruction, if you are not sure you are doing the exercise correctly.  Hold for __________ seconds. Slowly return to the starting position, resting the muscles completely before starting the next repetition. Repeat __________ times. Complete this exercise __________ times per day. STRENGTH - Scapular Protractors, Supine  Lie on your back on a firm surface. Extend your right / left arm straight into the air while holding a __________ weight in your hand.  Keeping your head and back in place, lift your shoulder off the floor.  Hold for __________ seconds. Slowly return to the starting position, and allow your muscles to relax completely before starting the next repetition. Repeat __________ times. Complete this exercise __________ times per day. STRENGTH - Scapular Protractors, Quadruped  Get onto your hands and knees, with your shoulders directly over your hands (or as close as you can be, comfortably).  Keeping your elbows locked, lift the back of your rib cage up into your shoulder blades, so your mid-back rounds out. Keep your neck muscles relaxed.  Hold this position for __________ seconds. Slowly return to the starting position and allow your muscles to relax completely before starting the  next repetition. Repeat __________ times. Complete this exercise __________ times per day.  STRENGTH - Scapular Retractors  Secure a rubber exercise band or tubing to a fixed object (table, pole), so that it is at the height of your shoulders when you are either standing, or sitting on a firm armless  chair.  With a palm down grip, grasp an end of the band in each hand. Straighten your elbows and lift your hands straight in front of you, at shoulder height. Step back, away from the secured end of the band, until it becomes tense.  Squeezing your shoulder blades together, draw your elbows back toward your sides, as you bend them. Keep your upper arms lifted away from your body throughout the exercise.  Hold for __________ seconds. Slowly ease the tension on the band, as you reverse the directions and return to the starting position. Repeat __________ times. Complete this exercise __________ times per day. STRENGTH - Shoulder Extensors   Secure a rubber exercise band or tubing to a fixed object (table, pole) so that it is at the height of your shoulders when you are either standing, or sitting on a firm armless chair.  With a thumbs-up grip, grasp an end of the band in each hand. Straighten your elbows and lift your hands straight in front of you, at shoulder height. Step back, away from the secured end of the band, until it becomes tense.  Squeezing your shoulder blades together, pull your hands down to the sides of your thighs. Do not allow your hands to go behind you.  Hold for __________ seconds. Slowly ease the tension on the band, as you reverse the directions and return to the starting position. Repeat __________ times. Complete this exercise __________ times per day.  STRENGTH - Scapular Retractors and External Rotators   Secure a rubber exercise band or tubing to a fixed object (table, pole) so that it is at the height as your shoulders, when you are either standing, or sitting on a  firm armless chair.  With a palm down grip, grasp an end of the band in each hand. Bend your elbows 90 degrees and lift your elbows to shoulder height, at your sides. Step back, away from the secured end of the band, until it becomes tense.  Squeezing your shoulder blades together, rotate your shoulders so that your upper arms and elbows remain stationary, but your fists travel upward to head height.  Hold for __________ seconds. Slowly ease the tension on the band, as you reverse the directions and return to the starting position. Repeat __________ times. Complete this exercise __________ times per day.  STRENGTH - Scapular Retractors and External Rotators, Rowing   Secure a rubber exercise band or tubing to a fixed object (table, pole) so that it is at the height of your shoulders, when you are either standing, or sitting on a firm armless chair.  With a palm down grip, grasp an end of the band in each hand. Straighten your elbows and lift your hands straight in front of you, at shoulder height. Step back, away from the secured end of the band, until it becomes tense.  Step 1: Squeeze your shoulder blades together. Bending your elbows, draw your hands to your chest, as if you are rowing a boat. At the end of this motion, your hands and elbow should be at shoulder height and your elbows should be out to your sides.  Step 2: Rotate your shoulders, to raise your hands above your head. Your forearms should be vertical and your upper arms should be horizontal.  Hold for __________ seconds. Slowly ease the tension on the band, as you reverse the directions and return to the starting position. Repeat __________ times. Complete this exercise __________ times per day.  STRENGTH - Scapular Depressors  Find a sturdy chair without wheels, such as a dining room chair.  Keeping your feet on the floor, and your hands on the chair arms, lift your bottom up from the seat, and lock your elbows.  Keeping  your elbows straight, allow gravity to pull your body weight down. Your shoulders will rise toward your ears.  Raise your body against gravity by drawing your shoulder blades down your back, shortening the distance between your shoulders and ears. Although your feet should always maintain contact with the floor, your feet should progressively support less body weight, as you get stronger.  Hold for __________ seconds. In a controlled and slow manner, lower your body weight to begin the next repetition. Repeat __________ times. Complete this exercise __________ times per day.  Document Released: 04/09/2005 Document Revised: 07/02/2011 Document Reviewed: 07/22/2008 [facility] Patient Information 2015 [place], [place]. This information is not intended to replace advice given to you by your health care provider. Make sure you discuss any questions you have with your health care provider.

## 2014-08-20 NOTE — Progress Notes (Signed)
 Chief Complaint:  Chief Complaint  Patient presents with  . Shoulder Pain  . Nasal Congestion    patient states he has nasal polps     HPI: Luke Benitez is a 54 y.o. male who is here for sinus congestion for the last 1 month, he has had "nasal polyps" in the past. He states he has blown out circular pea size  lesions He has taken decongestants such as Sudafed and it has not helped. He has had difficulty sleeping because he can't predict his nose.He does not have mucus. He initially thought it was allergies since he does have allergies and has taken allergy meds, ie certaraxine, and also sudafed. Has taken otc nsaal sprays in the past such as Afrin but it did not help . HAs been having this for 1 month, gotten abittle worse for 1 month. His brother has a history of polyps as well. And had had to have surgery. HE has not seen ENT, He has allergies.   Left posterior shoulder pain, moderate, sharp to dull pain, was seen several months back and was given a steroid injection for bursitis. X-rays were not taken. 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 It only really bothers him in the left lateral posterior area of his shoulder when he lifts weights standing. Weight can be about 100 pounds he can't lift weights. If he plans presses and does not have any pain. He has no weakness numbness or tingling.  Past Medical History  Diagnosis Date  . Allergy   . Ejection fraction    No past surgical history on file. History   Social History  . Marital Status: Divorced    Spouse Name: N/A  . Number of Children: N/A  . Years of Education: N/A   Social History Main Topics  . Smoking status: Never Smoker   . Smokeless tobacco: Not on file  . Alcohol Use: 2.4 oz/week    4 Cans of beer per week  . Drug Use: No  . Sexual Activity: No   Other Topics Concern  . None   Social History  Narrative   Family History  Problem Relation Age of Onset  . Cancer Mother   . Cancer Father   . Stroke Maternal Grandmother   . Emphysema Maternal Grandfather   . Heart attack Paternal Grandfather    No Known Allergies Prior to Admission medications   Not on File     ROS: The patient denies fevers, chills, night sweats, unintentional weight loss, chest pain, palpitations, wheezing, dyspnea on exertion, nausea, vomiting, abdominal pain, dysuria, hematuria, melena, numbness, weakness, or tingling.   All other systems have been reviewed and were otherwise negative with the exception of those mentioned in the HPI and as above.    PHYSICAL EXAM: Filed Vitals:   08/20/14 1356  BP: 118/68  Pulse: 92  Temp: 98.6 F (37 C)  Resp: 17   Filed Vitals:   08/20/14 1356  Height: 6' (1.829 m)  Weight: 197 lb (89.359 kg)   Body mass index is 26.71 kg/(m^2).  General: Alert, no acute distress HEENT:  Normocephalic, atraumatic, oropharynx patent. EOMI, PERRLA Positive erythematous turbinates. I do not see polyps per side but he does have very swollen turbinates. Tender sinuses Cardiovascular:  Regular rate and rhythm, no rubs murmurs or gallops.  No Carotid bruits, radial pulse intact. No pedal  edema.  Respiratory: Clear to auscultation bilaterally.  No wheezes, rales, or rhonchi.  No cyanosis, no use of accessory musculature GI: No organomegaly, abdomen is soft and non-tender, positive bowel sounds.  No masses. Skin: No rashes. Neurologic: Facial musculature symmetric. Psychiatric: Patient is appropriate throughout our interaction. Lymphatic: No cervical lymphadenopathy Musculoskeletal: Gait intact. Neck exam normal-neg spurling Shoulder No deformity, no hypertrophy/atrophy, no erythema, no fluid, no wounds. crepitus Full ROM Nontender at [facility] jt + Tenderness at left poster lateral shoulder, he has pain when he lifts above 90 degrees Neg Empty Can test, neg Lift off test, neg  Speeds, Neg Hawkins/Neers 5/5 strength, 2/2 triceps and biceps DTRs   LABS: Results for orders placed or performed during the hospital encounter of 03/12/14  Basic metabolic panel  Result Value Ref Range   Sodium 139 137 - 147 mEq/L   Potassium 3.9 3.7 - 5.3 mEq/L   Chloride 100 96 - 112 mEq/L   CO2 22 19 - 32 mEq/L   Glucose, Bld 153 (H) 70 - 99 mg/dL   BUN 16 6 - 23 mg/dL   Creatinine, Ser 1.61 0.50 - 1.35 mg/dL   Calcium 9.1 8.4 - 09.6 mg/dL   GFR calc non Af Amer >90 >90 mL/min   GFR calc Af Amer >90 >90 mL/min   Anion gap 17 (H) 5 - 15  CBC with Differential  Result Value Ref Range   WBC 6.8 4.0 - 10.5 K/uL   RBC 4.76 4.22 - 5.81 MIL/uL   Hemoglobin 14.4 13.0 - 17.0 g/dL   HCT 04.5 40.9 - 81.1 %   MCV 88.9 78.0 - 100.0 fL   MCH 30.3 26.0 - 34.0 pg   MCHC 34.0 30.0 - 36.0 g/dL   RDW 91.4 78.2 - 95.6 %   Platelets 314 150 - 400 K/uL   Neutrophils Relative % 48 43 - 77 %   Neutro Abs 3.3 1.7 - 7.7 K/uL   Lymphocytes Relative 42 12 - 46 %   Lymphs Abs 2.9 0.7 - 4.0 K/uL   Monocytes Relative 9 3 - 12 %   Monocytes Absolute 0.6 0.1 - 1.0 K/uL   Eosinophils Relative 1 0 - 5 %   Eosinophils Absolute 0.1 0.0 - 0.7 K/uL   Basophils Relative 0 0 - 1 %   Basophils Absolute 0.0 0.0 - 0.1 K/uL     EKG/XRAY:   Primary read interpreted by Dr. Conley Rolls at [facility].   ASSESSMENT/PLAN: Encounter Diagnoses  Name Primary?  . Left shoulder pain Yes  . Sinus congestion    54 year old gentleman with a past medical history of allergies. He is here with progressively worsening sinus congestion and also left shoulder pain most likely impingement/rotator cuff versus bursitis. I suspect that he may have some improvement with steroid nasal spray in addition to the antihistamine medications he is on. Advised to take over-the-counter Nasacort steroid nasal spray once she reads the directions and side effect profile. X-rays sprays of shoulder, sinuses deferred due to to lack of insurance. I will  prescribe him a trial of oral steroids. He will use this if he decides that the Nasacort is not helping him or if he decides not to use it. He will see if a trial of oral steroids will help his sinuses and also his left shoulder pain. Again the left shoulder pain is only when he does certain movements of his arm physically lifting weights and in the standing position. Left shoulder exercises were given  to the patient. He will call for x-rays and possibly a referral to a specialist or a steroid injection. Follow up as needed  Gross sideeffects, risk and benefits, and alternatives of medications d/w patient. Patient is aware that all medications have potential sideeffects and we are unable to predict every sideeffect or drug-drug interaction that may occur.  ,  PHUONG, DO 08/24/2014 9:27 AM

## 2015-06-21 ENCOUNTER — Ambulatory Visit: Payer: Self-pay

## 2015-09-21 ENCOUNTER — Ambulatory Visit (INDEPENDENT_AMBULATORY_CARE_PROVIDER_SITE_OTHER): Payer: Self-pay | Admitting: Family Medicine

## 2015-09-21 VITALS — BP 116/74 | HR 93 | Temp 98.1°F | Resp 18 | Ht 72.0 in | Wt 201.6 lb

## 2015-09-21 DIAGNOSIS — J019 Acute sinusitis, unspecified: Secondary | ICD-10-CM

## 2015-09-21 MED ORDER — PREDNISONE 20 MG PO TABS: ORAL_TABLET | ORAL | Status: DC

## 2015-09-21 MED ORDER — AMOXICILLIN 500 MG PO CAPS: 1000.0000 mg | ORAL_CAPSULE | Freq: Two times a day (BID) | ORAL | Status: DC

## 2015-09-21 NOTE — Progress Notes (Signed)
Subjective:  This chart was scribed for Luke Sorenson MD, by [name], at [facility].  This patient was seen in room 12 and the patient's care was started at 8:38 AM.   Chief Complaint  Patient presents with  . Sinusitis    Especially at night. OTC meds and nasal sprays are not helping. x4 days     Patient ID: Luke Benitez, male    DOB: [birth year], 55 y.o.   MRN: [id number]  HPI HPI Comments: Luke Benitez is a 56 y.o. male who presents to the [facility] complaining of sinus congestion which is worse during the night onset 4-5 days ago.  He has associated symptoms of pressure around his eyes and slight ear pain.  Patient has not been able to sleep with ease at night due to his inability to breathe with ease.  He denies fever/chills, sore throat, productive cough.  He has tried over the counter nasal spray (which opens up his nasal passage but not his sinuses), sudafed, Flonase and allergy medication but denies any relief.  Patient has mild seasonal allergies to pollen and is allergic to dust.      Past Medical History  Diagnosis Date  . Allergy   . Ejection fraction     No current outpatient prescriptions on file prior to visit.   No current facility-administered medications on file prior to visit.    No Known Allergies    Review of Systems  Constitutional: Negative for fever and chills.  HENT: Positive for congestion, ear pain and sinus pressure. Negative for sore throat.   Eyes: Negative for pain, redness and itching.  Respiratory: Negative for cough, choking and shortness of breath.   Gastrointestinal: Negative for nausea and vomiting.  Musculoskeletal: Negative for neck pain and neck stiffness.  Neurological: Negative for syncope and speech difficulty.       Objective:   Physical Exam  Constitutional: He appears well-developed and well-nourished. No distress.  HENT:  Head: Normocephalic and atraumatic.  Small  mid ear effusion bilaterally Nares and oropharynx are normal.   Eyes: Pupils are equal, round, and reactive to light.  Neck: Normal range of motion.  anterior cervical and tonsillar adenopathy  Cardiovascular: Normal rate, regular rhythm, S1 normal, S2 normal and normal heart sounds.  Exam reveals no gallop and no friction rub.   No murmur heard. Pulmonary/Chest: Effort normal and breath sounds normal. No respiratory distress. He has no wheezes. He has no rales.  Good air movement.   Musculoskeletal: Normal range of motion.  Neurological: He is alert.  Skin: Skin is warm and dry.  Psychiatric: He has a normal mood and affect. His behavior is normal.   Filed Vitals:   09/21/15 0828  BP: 116/74  Pulse: 93  Temp: 98.1 F (36.7 C)  TempSrc: Oral  Resp: 18  Height: 6' (1.829 m)  Weight: 201 lb 9.6 oz (91.445 kg)  SpO2: 97%       Assessment & Plan:   1. Acute sinusitis, recurrence not specified, unspecified location   Ok to use nyquil or benadryl to help sleep at night if needed   Meds ordered this encounter  Medications  . predniSONE (DELTASONE) 20 MG tablet    Sig: Take 3 tabs qd x 3d, then 2 tabs qd x 3d then 1 tab qd x 3d.    Dispense:  18 tablet    Refill:  0  . amoxicillin (AMOXIL) 500 MG capsule  Sig: Take 2 capsules (1,000 mg total) by mouth 2 (two) times daily.    Dispense:  40 capsule    Refill:  0    I personally performed the services described in this documentation, which was scribed in my presence. The recorded information has been reviewed and considered, and addended by me as needed.   Luke SorensonEva Antaeus Karel, M.D.  [facility]  [place] [address] [place], [place] [address] [phone] phone [phone] fax  09/21/2015 8:48 AM

## 2015-09-21 NOTE — Patient Instructions (Addendum)
   IF you received an x-ray today, you will receive an invoice from [facility]. Please contact [facility] at [phone] with questions or concerns regarding your invoice.   IF you received labwork today, you will receive an invoice from [facility]. Please contact [facility] at [phone] with questions or concerns regarding your invoice.   Our billing staff will not be able to assist you with questions regarding bills from these companies.  You will be contacted with the lab results as soon as they are available. The fastest way to get your results is to activate your My Chart account. Instructions are located on the last page of this paperwork. If you have not heard from us regarding the results in 2 weeks, please contact this office.     Sinusitis, Adult Sinusitis is redness, soreness, and inflammation of the paranasal sinuses. Paranasal sinuses are air pockets within the bones of your face. They are located beneath your eyes, in the middle of your forehead, and above your eyes. In healthy paranasal sinuses, mucus is able to drain out, and air is able to circulate through them by way of your nose. However, when your paranasal sinuses are inflamed, mucus and air can become trapped. This can allow bacteria and other germs to grow and cause infection. Sinusitis can develop quickly and last only a short time (acute) or continue over a long period (chronic). Sinusitis that lasts for more than 12 weeks is considered chronic. CAUSES Causes of sinusitis include:  Allergies.  Structural abnormalities, such as displacement of the cartilage that separates your nostrils (deviated septum), which can decrease the air flow through your nose and sinuses and affect sinus drainage.  Functional abnormalities, such as when the small hairs (cilia) that line your sinuses and help remove mucus do not work properly or are not present. SIGNS AND SYMPTOMS Symptoms  of acute and chronic sinusitis are the same. The primary symptoms are pain and pressure around the affected sinuses. Other symptoms include:  Upper toothache.  Earache.  Headache.  Bad breath.  Decreased sense of smell and taste.  A cough, which worsens when you are lying flat.  Fatigue.  Fever.  Thick drainage from your nose, which often is green and may contain pus (purulent).  Swelling and warmth over the affected sinuses. DIAGNOSIS Your health care provider will perform a physical exam. During your exam, your health care provider may perform any of the following to help determine if you have acute sinusitis or chronic sinusitis:  Look in your nose for signs of abnormal growths in your nostrils (nasal polyps).  Tap over the affected sinus to check for signs of infection.  View the inside of your sinuses using an imaging device that has a light attached (endoscope). If your health care provider suspects that you have chronic sinusitis, one or more of the following tests may be recommended:  Allergy tests.  Nasal culture. A sample of mucus is taken from your nose, sent to a lab, and screened for bacteria.  Nasal cytology. A sample of mucus is taken from your nose and examined by your health care provider to determine if your sinusitis is related to an allergy. TREATMENT Most cases of acute sinusitis are related to a viral infection and will resolve on their own within 10 days. Sometimes, medicines are prescribed to help relieve symptoms of both acute and chronic sinusitis. These may include pain medicines, decongestants, nasal steroid sprays, or saline sprays. However, for sinusitis related   to a bacterial infection, your health care provider will prescribe antibiotic medicines. These are medicines that will help kill the bacteria causing the infection. Rarely, sinusitis is caused by a fungal infection. In these cases, your health care provider will prescribe antifungal  medicine. For some cases of chronic sinusitis, surgery is needed. Generally, these are cases in which sinusitis recurs more than 3 times per year, despite other treatments. HOME CARE INSTRUCTIONS  Drink plenty of water. Water helps thin the mucus so your sinuses can drain more easily.  Use a humidifier.  Inhale steam 3-4 times a day (for example, sit in the bathroom with the shower running).  Apply a warm, moist washcloth to your face 3-4 times a day, or as directed by your health care provider.  Use saline nasal sprays to help moisten and clean your sinuses.  Take medicines only as directed by your health care provider.  If you were prescribed either an antibiotic or antifungal medicine, finish it all even if you start to feel better. SEEK IMMEDIATE MEDICAL CARE IF:  You have increasing pain or severe headaches.  You have nausea, vomiting, or drowsiness.  You have swelling around your face.  You have vision problems.  You have a stiff neck.  You have difficulty breathing.   This information is not intended to replace advice given to you by your health care provider. Make sure you discuss any questions you have with your health care provider.   Document Released: 04/09/2005 Document Revised: 04/30/2014 Document Reviewed: 04/24/2011 Elsevier Interactive Patient Education 2016 [employer]  

## 2017-06-04 ENCOUNTER — Ambulatory Visit: Payer: Self-pay | Admitting: Emergency Medicine

## 2017-06-04 ENCOUNTER — Ambulatory Visit (INDEPENDENT_AMBULATORY_CARE_PROVIDER_SITE_OTHER): Payer: Self-pay

## 2017-06-04 ENCOUNTER — Other Ambulatory Visit: Payer: Self-pay

## 2017-06-04 ENCOUNTER — Encounter: Payer: Self-pay | Admitting: Emergency Medicine

## 2017-06-04 VITALS — BP 128/78 | HR 82 | Temp 98.2°F | Resp 16 | Ht 72.0 in | Wt 217.0 lb

## 2017-06-04 DIAGNOSIS — M25531 Pain in right wrist: Secondary | ICD-10-CM

## 2017-06-04 DIAGNOSIS — S6991XA Unspecified injury of right wrist, hand and finger(s), initial encounter: Secondary | ICD-10-CM

## 2017-06-04 DIAGNOSIS — Z23 Encounter for immunization: Secondary | ICD-10-CM

## 2017-06-04 NOTE — Progress Notes (Signed)
Luke Benitez 57 y.o.   Chief Complaint  Patient presents with  . Fall    RIGHT hand injury after falling off the roof 06/03/17 pm    HISTORY OF PRESENT ILLNESS: This is a 57 y.o. male injured right wrist last evening.  Complaining of pain and swelling.  No other injuries.  HPI   Prior to Admission medications   Medication Sig Start Date End Date Taking? Authorizing Provider  predniSONE (DELTASONE) 20 MG tablet Take 3 tabs qd x 3d, then 2 tabs qd x 3d then 1 tab qd x 3d. Patient not taking: Reported on 06/04/2017 09/21/15   Sherren MochaShaw, Eva N, MD    No Known Allergies  Patient Active Problem List   Diagnosis Date Noted  . Abnormal EKG 03/17/2014  . Ejection fraction   . Paroxysmal atrial fibrillation (HCC) 03/12/2014    Past Medical History:  Diagnosis Date  . Allergy   . Ejection fraction     No past surgical history on file.  Social History   Socioeconomic History  . Marital status: Divorced    Spouse name: Not on file  . Number of children: Not on file  . Years of education: Not on file  . Highest education level: Not on file  Social Needs  . Financial resource strain: Not on file  . Food insecurity - worry: Not on file  . Food insecurity - inability: Not on file  . Transportation needs - medical: Not on file  . Transportation needs - non-medical: Not on file  Occupational History  . Not on file  Tobacco Use  . Smoking status: Never Smoker  . Smokeless tobacco: Never Used  Substance and Sexual Activity  . Alcohol use: Yes    Alcohol/week: 2.4 oz    Types: 4 Cans of beer per week  . Drug use: No  . Sexual activity: No  Other Topics Concern  . Not on file  Social History Narrative  . Not on file    Family History  Problem Relation Age of Onset  . Cancer Mother   . Cancer Father   . Stroke Maternal Grandmother   . Emphysema Maternal Grandfather   . Heart attack Paternal Grandfather      ROS  Vitals:   06/04/17 1606  BP: 128/78  Pulse: 82    Resp: 16  Temp: 98.2 F (36.8 C)  SpO2: 97%    Physical Exam  Constitutional: He is oriented to person, place, and time. He appears well-developed and well-nourished.  HENT:  Head: Normocephalic.  Eyes: Pupils are equal, round, and reactive to light.  Neck: Normal range of motion.  Cardiovascular: Normal rate.  Pulmonary/Chest: Effort normal.  Musculoskeletal:  Right wrist: Positive swelling, limited range of motion, no bruising. Right hand: Mild swelling, no localized tenderness, full range of motion of all fingers. NVI  Neurological: He is alert and oriented to person, place, and time. No sensory deficit. He exhibits normal muscle tone.  Skin: Skin is warm and dry. Capillary refill takes less than 2 seconds.  Psychiatric: He has a normal mood and affect. His behavior is normal.  Vitals reviewed.  Dg Wrist Complete Right  Result Date: 06/04/2017 CLINICAL DATA:  Pain following injury EXAM: RIGHT WRIST - COMPLETE 3+ VIEW COMPARISON:  None. FINDINGS: Frontal, oblique, lateral, and ulnar deviation scaphoid images were obtained. There is no fracture or dislocation. Joint spaces appear normal. No erosive change. IMPRESSION: No fracture or dislocation.  No evident arthropathy. Electronically Signed  By: Bretta Bang III M.D.   On: 06/04/2017 16:35     ASSESSMENT & PLAN: Luke Benitez was seen today for fall.  Diagnoses and all orders for this visit:  Injury of right wrist, initial encounter -     DG Wrist Complete Right; Future  Right wrist pain  Need for diphtheria-tetanus-pertussis (Tdap) vaccine -     Tdap vaccine greater than or equal to 7yo IM  Other orders -     Cancel: Ambulatory referral to Gastroenterology    Patient Instructions       IF you received an x-ray today, you will receive an invoice from [facility]. Please contact [facility] at [phone] with questions or concerns regarding your invoice.   IF you received labwork today, you  will receive an invoice from [place]. Please contact [facility] at [phone] with questions or concerns regarding your invoice.   Our billing staff will not be able to assist you with questions regarding bills from these companies.  You will be contacted with the lab results as soon as they are available. The fastest way to get your results is to activate your My Chart account. Instructions are located on the last page of this paperwork. If you have not heard from [place] regarding the results in 2 weeks, please contact this office.     Wrist Sprain, Adult A wrist sprain is a stretch or tear in the strong, fibrous tissues (ligaments) that connect your wrist bones. There are three types of wrist sprains:  Grade 1. In this type of sprain, the ligament is stretched more than normal.  Grade 2. In this type of sprain, the ligament is partially torn. You may be able to move your wrist, but not very much.  Grade 3. In this type of sprain, the ligament or muscle is completely torn. You may find it difficult or extremely painful to move your wrist even a little.  What are the causes? A wrist sprain can be caused by using the wrist too much during sports, exercise, or at work. It can also happen with a fall or during an accident. What increases the risk? This condition is more likely to occur in people:  With a previous wrist or arm injury.  With poor wrist strength and flexibility.  Who play contact sports, such as football or soccer.  Who play sports that may result in a fall, such as skateboarding, biking, skiing, or snowboarding.  Who do not exercise regularly.  Who use exercise equipment that does not fit well.  What are the signs or symptoms? Symptoms of this condition include:  Pain in the wrist, arm, or hand.  Swelling or bruised skin near the wrist, hand, or arm. The skin may look yellow or kind of blue.  Stiffness or trouble moving the hand.  Hearing a pop or feeling a tear at  the time of the injury.  A warm feeling in the skin around the wrist.  How is this diagnosed? This condition is diagnosed with a physical exam. Sometimes an X-ray is taken to make sure a bone did not break. If your health care provider thinks that you tore a ligament, he or she may order an MRI of your wrist. How is this treated? This condition is treated by resting and applying ice to your wrist. Additional treatment may include:  Medicine for pain and inflammation.  A splint to keep your wrist still (immobilized).  Exercises to strengthen and stretch your wrist.  Surgery. This may  be done if the ligament is completely torn.  Follow these instructions at home: If you have a splint:   Do not put pressure on any part of the splint until it is fully hardened. This may take several hours.  Wear the splint as told by your health care provider. Remove it only as told by your health care provider.  Loosen the splint if your fingers tingle, become numb, or turn cold and blue.  If your splint is not waterproof: ? Do not let it get wet. ? Cover it with a watertight covering when you take a bath or a shower.  Keep the splint clean. Managing pain, stiffness, and swelling   If directed, put ice on the injured area. ? If you have a removable splint, remove it as told by your health care provider. ? Put ice in a plastic bag. ? Place a towel between your skin and the bag or between the splint and the bag. ? Leave the ice on for 20 minutes, 2-3 times per day.  Move your fingers often to avoid stiffness and to lessen swelling.  Raise (elevate) the injured area above the level of your heart while you are sitting or lying down. Activity  Rest your wrist. Do not do things that cause pain.  Return to your normal activities as told by your health care provider. Ask your health care provider what activities are safe for you.  Do exercises as told by your health care provider. General  instructions  Take over-the-counter and prescription medicines only as told by your health care provider.  Do not use any products that contain nicotine or tobacco, such as cigarettes and e-cigarettes. These can delay healing. If you need help quitting, ask your health care provider.  Ask your health care provider when it is safe to drive if you have a splint.  Keep all follow-up visits as told by your health care provider. This is important. Contact a health care provider if:  Your pain, bruising, or swelling gets worse.  Your skin becomes red, gets a rash, or has open sores.  Your pain does not get better or it gets worse. Get help right away if:  You have a new or sudden sharp pain in the hand, arm, or wrist.  You have tingling or numbness in your hand.  Your fingers turn white, very red, or cold and blue.  You cannot move your fingers. This information is not intended to replace advice given to you by your health care provider. Make sure you discuss any questions you have with your health care provider. Document Released: 12/11/2013 Document Revised: 11/05/2015 Document Reviewed: 10/27/2015 Elsevier Interactive Patient Education  2018 [employer].      Edwina Barth, MD [facility]

## 2017-06-04 NOTE — Patient Instructions (Addendum)
   IF you received an x-ray today, you will receive an invoice from [facility]. Please contact [facility] at [phone] with questions or concerns regarding your invoice.   IF you received labwork today, you will receive an invoice from [facility]. Please contact [facility] at [phone] with questions or concerns regarding your invoice.   Our billing staff will not be able to assist you with questions regarding bills from these companies.  You will be contacted with the lab results as soon as they are available. The fastest way to get your results is to activate your My Chart account. Instructions are located on the last page of this paperwork. If you have not heard from us regarding the results in 2 weeks, please contact this office.     Wrist Sprain, Adult A wrist sprain is a stretch or tear in the strong, fibrous tissues (ligaments) that connect your wrist bones. There are three types of wrist sprains:  Grade 1. In this type of sprain, the ligament is stretched more than normal.  Grade 2. In this type of sprain, the ligament is partially torn. You may be able to move your wrist, but not very much.  Grade 3. In this type of sprain, the ligament or muscle is completely torn. You may find it difficult or extremely painful to move your wrist even a little.  What are the causes? A wrist sprain can be caused by using the wrist too much during sports, exercise, or at work. It can also happen with a fall or during an accident. What increases the risk? This condition is more likely to occur in people:  With a previous wrist or arm injury.  With poor wrist strength and flexibility.  Who play contact sports, such as football or soccer.  Who play sports that may result in a fall, such as skateboarding, biking, skiing, or snowboarding.  Who do not exercise regularly.  Who use exercise equipment that does not fit well.  What are the signs or symptoms? Symptoms of  this condition include:  Pain in the wrist, arm, or hand.  Swelling or bruised skin near the wrist, hand, or arm. The skin may look yellow or kind of blue.  Stiffness or trouble moving the hand.  Hearing a pop or feeling a tear at the time of the injury.  A warm feeling in the skin around the wrist.  How is this diagnosed? This condition is diagnosed with a physical exam. Sometimes an X-ray is taken to make sure a bone did not break. If your health care provider thinks that you tore a ligament, he or she may order an MRI of your wrist. How is this treated? This condition is treated by resting and applying ice to your wrist. Additional treatment may include:  Medicine for pain and inflammation.  A splint to keep your wrist still (immobilized).  Exercises to strengthen and stretch your wrist.  Surgery. This may be done if the ligament is completely torn.  Follow these instructions at home: If you have a splint:   Do not put pressure on any part of the splint until it is fully hardened. This may take several hours.  Wear the splint as told by your health care provider. Remove it only as told by your health care provider.  Loosen the splint if your fingers tingle, become numb, or turn cold and blue.  If your splint is not waterproof: ? Do not let it get wet. ? Cover it with   a watertight covering when you take a bath or a shower.  Keep the splint clean. Managing pain, stiffness, and swelling   If directed, put ice on the injured area. ? If you have a removable splint, remove it as told by your health care provider. ? Put ice in a plastic bag. ? Place a towel between your skin and the bag or between the splint and the bag. ? Leave the ice on for 20 minutes, 2-3 times per day.  Move your fingers often to avoid stiffness and to lessen swelling.  Raise (elevate) the injured area above the level of your heart while you are sitting or lying down. Activity  Rest your wrist.  Do not do things that cause pain.  Return to your normal activities as told by your health care provider. Ask your health care provider what activities are safe for you.  Do exercises as told by your health care provider. General instructions  Take over-the-counter and prescription medicines only as told by your health care provider.  Do not use any products that contain nicotine or tobacco, such as cigarettes and e-cigarettes. These can delay healing. If you need help quitting, ask your health care provider.  Ask your health care provider when it is safe to drive if you have a splint.  Keep all follow-up visits as told by your health care provider. This is important. Contact a health care provider if:  Your pain, bruising, or swelling gets worse.  Your skin becomes red, gets a rash, or has open sores.  Your pain does not get better or it gets worse. Get help right away if:  You have a new or sudden sharp pain in the hand, arm, or wrist.  You have tingling or numbness in your hand.  Your fingers turn white, very red, or cold and blue.  You cannot move your fingers. This information is not intended to replace advice given to you by your health care provider. Make sure you discuss any questions you have with your health care provider. Document Released: 12/11/2013 Document Revised: 11/05/2015 Document Reviewed: 10/27/2015 Elsevier Interactive Patient Education  2018 [employer]vier Inc[employer]  

## 2019-03-13 ENCOUNTER — Ambulatory Visit: Payer: Self-pay | Admitting: Family Medicine

## 2019-03-17 ENCOUNTER — Other Ambulatory Visit: Payer: Self-pay

## 2019-03-17 ENCOUNTER — Ambulatory Visit (INDEPENDENT_AMBULATORY_CARE_PROVIDER_SITE_OTHER): Payer: Self-pay | Admitting: Cardiology

## 2019-03-17 VITALS — BP 143/77 | HR 90 | Ht 72.5 in | Wt 221.0 lb

## 2019-03-17 DIAGNOSIS — Z713 Dietary counseling and surveillance: Secondary | ICD-10-CM

## 2019-03-17 DIAGNOSIS — Z7189 Other specified counseling: Secondary | ICD-10-CM

## 2019-03-17 DIAGNOSIS — I480 Paroxysmal atrial fibrillation: Secondary | ICD-10-CM

## 2019-03-17 DIAGNOSIS — Z7182 Exercise counseling: Secondary | ICD-10-CM

## 2019-03-17 DIAGNOSIS — R002 Palpitations: Secondary | ICD-10-CM

## 2019-03-17 LAB — BASIC METABOLIC PANEL
BUN/Creatinine Ratio: 12 (ref 9–20)
BUN: 18 mg/dL (ref 6–24)
CO2: 21 mmol/L (ref 20–29)
Calcium: 9.7 mg/dL (ref 8.7–10.2)
Chloride: 100 mmol/L (ref 96–106)
Creatinine, Ser: 1.5 mg/dL — ABNORMAL HIGH (ref 0.76–1.27)
GFR calc Af Amer: 58 mL/min/{1.73_m2} — ABNORMAL LOW (ref >59)
GFR calc non Af Amer: 50 mL/min/{1.73_m2} — ABNORMAL LOW (ref >59)
Glucose: 96 mg/dL (ref 65–99)
Potassium: 4.6 mmol/L (ref 3.5–5.2)
Sodium: 137 mmol/L (ref 134–144)

## 2019-03-17 LAB — CBC WITH DIFFERENTIAL/PLATELET
Basophils Absolute: 0 10*3/uL (ref 0.0–0.2)
Basos: 0 %
EOS (ABSOLUTE): 0 10*3/uL (ref 0.0–0.4)
Eos: 0 %
Hematocrit: 44.3 % (ref 37.5–51.0)
Hemoglobin: 15.3 g/dL (ref 13.0–17.7)
Immature Grans (Abs): 0 10*3/uL (ref 0.0–0.1)
Immature Granulocytes: 0 %
Lymphocytes Absolute: 2.4 10*3/uL (ref 0.7–3.1)
Lymphs: 28 %
MCH: 30.5 pg (ref 26.6–33.0)
MCHC: 34.5 g/dL (ref 31.5–35.7)
MCV: 88 fL (ref 79–97)
Monocytes Absolute: 0.7 10*3/uL (ref 0.1–0.9)
Monocytes: 8 %
Neutrophils Absolute: 5.3 10*3/uL (ref 1.4–7.0)
Neutrophils: 64 %
Platelets: 300 10*3/uL (ref 150–450)
RBC: 5.01 x10E6/uL (ref 4.14–5.80)
RDW: 12.7 % (ref 11.6–15.4)
WBC: 8.5 10*3/uL (ref 3.4–10.8)

## 2019-03-17 LAB — LIPID PANEL WITH LDL/HDL RATIO
Cholesterol, Total: 275 mg/dL — ABNORMAL HIGH (ref 100–199)
HDL: 36 mg/dL — ABNORMAL LOW (ref >39)
LDL Chol Calc (NIH): 150 mg/dL — ABNORMAL HIGH (ref 0–99)
LDL/HDL Ratio: 4.2 ratio — ABNORMAL HIGH (ref 0.0–3.6)
Triglycerides: 467 mg/dL — ABNORMAL HIGH (ref 0–149)
VLDL Cholesterol Cal: 89 mg/dL — ABNORMAL HIGH (ref 5–40)

## 2019-03-17 LAB — THYROID PANEL WITH TSH
Free Thyroxine Index: 1.6 (ref 1.2–4.9)
T3 Uptake Ratio: 28 % (ref 24–39)
T4, Total: 5.7 ug/dL (ref 4.5–12.0)
TSH: 2.08 u[IU]/mL (ref 0.450–4.500)

## 2019-03-17 LAB — MAGNESIUM: Magnesium: 2 mg/dL (ref 1.6–2.3)

## 2019-03-17 NOTE — Progress Notes (Signed)
Cardiology Office Note:    Date:  03/17/2019   ID:  Luke Benitez, DOB [birth year], MRN [id number]  PCP:  Patient, No Pcp Per  Cardiologist:  Luke Dresser, MD (last seen by Luke Benitez in 2015)  Referring MD: No ref. provider found   CC: new patient evaluation for arrhythmia  History of Present Illness:    Luke Benitez is a 58 y.o. male with a hx of prior paroxysmal atrial fibrillation who is seen as a new patient (self referred) for the evaluation and management of arrhythmias.  Luke Benitez' last note from 2015 reviewed. History of paroxysmal atrial fibrillation. Echo and echo stress from 2015 below, largely normal. Was thought to be associated possibly to caffeine/alcohol use.   Today: Has noticed intermittent palpitations for years, but has noted that these are getting more frequent. Was having every other day, now less frequent since making the appointment. Can last 45 minutes up to an hour.  -Associated symptoms: had a separate time of heart pounding/questioned if it was a panic attack, got better as he calmed himself down. Lasted about two minutes. -Aggravating/alleviating factors: better if he drinks fluids, takes magnesium. Sometimes better if he coughs -Syncope/near syncope: none -Prior cardiac history: afib, echo, stress echo as below -Prior ECG: 2015, NSR, ICVD/LAFB -Prior workup: ecg, echo, stress echo -Prior treatment: converted spontaneously.  -Comorbidities: none. Never smoker -Exercise level: active  -Cardiac ROS: no chest pain, no shortness of breath, no PND, no orthopnea, no LE edema. -Family history: father has afib, takes metoprolol for it. Daughter has palpitations as well.   Stays hydrated, monitors alcohol use and tries to keep to one drink at a time. Has changed diet, makes sure to get a lot of potassium/magnesium in his diet. Does strength training/conditioning 2 days/week for an hour each time.    Past Medical History:  Diagnosis Date  . Allergy    . Ejection fraction     History reviewed. No pertinent surgical history.  Current Medications: No current outpatient medications on file prior to visit.   No current facility-administered medications on file prior to visit.      Allergies:   Patient has no known allergies.   Social History   Tobacco Use  . Smoking status: Never Smoker  . Smokeless tobacco: Never Used  Substance Use Topics  . Alcohol use: Yes    Alcohol/week: 4.0 standard drinks    Types: 4 Cans of beer per week  . Drug use: No    Family History: family history includes Cancer in his father and mother; Emphysema in his maternal grandfather; Heart attack in his paternal grandfather; Stroke in his maternal grandmother.  ROS:   Please see the history of present illness.  Additional pertinent ROS: Constitutional: Negative for chills, fever, night sweats, unintentional weight loss  HENT: Negative for ear pain and hearing loss.   Eyes: Negative for loss of vision and eye pain.  Respiratory: Negative for cough, sputum, wheezing.   Cardiovascular: See HPI. Gastrointestinal: Negative for abdominal pain, melena, and hematochezia.  Genitourinary: Negative for dysuria and hematuria.  Musculoskeletal: Negative for falls and myalgias.  Skin: Negative for itching and rash.  Neurological: Negative for focal weakness, focal sensory changes and loss of consciousness.  Endo/Heme/Allergies: Does not bruise/bleed easily.     EKGs/Labs/Other Studies Reviewed:    The following studies were reviewed today: Echo stress 03/26/2014 - Stress ECG conclusions: There were no stress arrhythmias or  conduction abnormalities. The stress ECG was negative for  ischemia.  -  Staged echo: There was no echocardiographic evidence for  stress-induced ischemia.   Echo 03/12/2014 - Left ventricle: The cavity size was normal. There was mild focal  basal hypertrophy of the septum. Systolic function was vigorous.  The estimated  ejection fraction was in the range of 65% to 70%.  Wall motion was normal; there were no regional wall motion  abnormalities.  - Aortic valve: There was mild regurgitation.   EKG:  EKG is personally reviewed.  The ekg ordered today demonstrates NSR at 90 bpm with LAFB  Recent Labs: 03/17/2019: BUN 18; Creatinine, Ser 1.50; Hemoglobin 15.3; Magnesium 2.0; Platelets 300; Potassium 4.6; Sodium 137; TSH 2.080  Recent Lipid Panel    Component Value Date/Time   CHOL 275 (H) 03/17/2019 1139   TRIG 467 (H) 03/17/2019 1139   HDL 36 (L) 03/17/2019 1139   LDLCALC 150 (H) 03/17/2019 1139    Physical Exam:    VS:  BP (!) 143/77   Pulse 90   Ht 6' 0.5" (1.842 m)   Wt 221 lb (100.2 kg)   SpO2 96%   BMI 29.56 kg/m     Wt Readings from Last 3 Encounters:  03/17/19 221 lb (100.2 kg)  06/04/17 217 lb (98.4 kg)  09/21/15 201 lb 9.6 oz (91.4 kg)    GEN: Well nourished, well developed in no acute distress HEENT: Normal, moist mucous membranes NECK: No JVD CARDIAC: regular rhythm, normal S1 and S2, no rubs or gallops. No murmurs. VASCULAR: Radial and DP pulses 2+ bilaterally. No carotid bruits RESPIRATORY:  Clear to auscultation without rales, wheezing or rhonchi  ABDOMEN: Soft, non-tender, non-distended MUSCULOSKELETAL:  Ambulates independently SKIN: Warm and dry, no edema NEUROLOGIC:  Alert and oriented x 3. No focal neuro deficits noted. PSYCHIATRIC:  Normal affect    ASSESSMENT:    1. Palpitations   2. Paroxysmal atrial fibrillation (HCC)   3. Cardiac risk counseling   4. Counseling on health promotion and disease prevention   5. Nutritional counseling   6. Exercise counseling    PLAN:    Paroxysmal atrial fib: having palpitations now of unclear etiology -we discussed event monitor vs. Intermittent monitor such as K[employer]iaMobile. As his palpitations are becoming less frequent, he will monitor for now. He will contact me if they become more frequent -has not had labs in years.  Will check renal function, thyroid, CBC, Magnesium today to exclude them as causes ADDED: renal function mildly abnormal compared to prior. CBC, thyroid, magnesium ok. Will recheck to make sure this is not spurious, instructed to hydrate -CHA2DS2/VAS Stroke Risk Points=0, no indication for anticoagulation  Cardiac risk counseling and prevention recommendations: -recommend heart healthy/Mediterranean diet, with whole grains, fruits, vegetable, fish, lean meats, nuts, and olive oil. Limit salt. -recommend moderate walking, 3-5 times/week for 30-50 minutes each session. Aim for at least 150 minutes.week. Goal should be pace of 3 miles/hours, or walking 1.5 miles in 30 minutes -recommend avoidance of tobacco products. Avoid excess alcohol. -Additional risk factor control:  -Lipids: screen today. ADDEDED: very abnormal lipids. Will need to recheck fasting  -Blood pressure control: mildly elevated today, but no history. Monitor. May need medication if consistently remains >140/90  -Weight: BMI 29, working on diet/exercise -ASCVD risk score: The 10-year ASCVD risk score Denman George DC Montez Hageman., et al., 2013) is: 16.3%   Values used to calculate the score:     Age: 51 years     Sex: Male     Is Non-Hispanic African American: No  Diabetic: No     Tobacco smoker: No     Systolic Blood Pressure: 143 mmHg     Is BP treated: No     HDL Cholesterol: 36 mg/dL     Total Cholesterol: 275 mg/dL    Plan for follow up: 1 year or sooner based on results of testing ADDENDED TO ADD: mildly abnormal renal function compared to last available 5 years ago. Cholesterol very abnormal. Recommended to hydrate, establish with primary care, repeat labs and follow up with me in 1 month to monitor  Medication Adjustments/Labs and Tests Ordered: Current medicines are reviewed at length with the patient today.  Concerns regarding medicines are outlined above.  Orders Placed This Encounter  Procedures  . Thyroid Panel With TSH  .  Basic Metabolic Panel (BMET)  . Magnesium  . CBC w/Diff/Platelet  . Lipid Panel With LDL/HDL Ratio  . EKG 12-Lead   No orders of the defined types were placed in this encounter.   Patient Instructions  Medication Instructions:  Your Physician recommend you continue on your current medication as directed.    *If you need a refill on your cardiac medications before your next appointment, please call your pharmacy*  Lab Work: Your physician recommends that you return for lab work today  (BMP, Thyroid, MG, Lipid, CBC)  If you have labs (blood work) drawn today and your tests are completely normal, you will receive your results only by: Marland Kitchen. MyChart Message (if you have MyChart) OR . A paper copy in the mail If you have any lab test that is abnormal or we need to change your treatment, we will call you to review the results.  Testing/Procedures: None  Follow-Up: At [facility], you and your health needs are our priority.  As part of our continuing mission to provide you with exceptional heart care, we have created designated Provider Care Teams.  These Care Teams include your primary Cardiologist (physician) and Advanced Practice Providers (APPs -  Physician Assistants and Nurse Practitioners) who all work together to provide you with the care you need, when you need it.  Your next appointment:   1 year(s)  The format for your next appointment:   In Person  Provider:   Jodelle RedBridgette Kristiane Morsch, MD  [employer]ia[employer] monitor.   Signed, Jodelle RedBridgette Sayuri Rhames, MD PhD 03/17/2019  [facility]

## 2019-03-17 NOTE — Patient Instructions (Addendum)
Medication Instructions:  Your Physician recommend you continue on your current medication as directed.    *If you need a refill on your cardiac medications before your next appointment, please call your pharmacy*  Lab Work: Your physician recommends that you return for lab work today  (BMP, Thyroid, MG, Lipid, CBC)  If you have labs (blood work) drawn today and your tests are completely normal, you will receive your results only by: Marland Kitchen MyChart Message (if you have MyChart) OR . A paper copy in the mail If you have any lab test that is abnormal or we need to change your treatment, we will call you to review the results.  Testing/Procedures: None  Follow-Up: At [facility], you and your health needs are our priority.  As part of our continuing mission to provide you with exceptional heart care, we have created designated Provider Care Teams.  These Care Teams include your primary Cardiologist (physician) and Advanced Practice Providers (APPs -  Physician Assistants and Nurse Practitioners) who all work together to provide you with the care you need, when you need it.  Your next appointment:   1 year(s)  The format for your next appointment:   In Person  Provider:   Buford Dresser, MD  [employer] KardiaMobile monitor.

## 2019-03-18 ENCOUNTER — Other Ambulatory Visit: Payer: Self-pay

## 2019-03-18 DIAGNOSIS — Z79899 Other long term (current) drug therapy: Secondary | ICD-10-CM

## 2019-03-22 ENCOUNTER — Encounter: Payer: Self-pay | Admitting: Cardiology

## 2019-03-23 IMAGING — DX DG WRIST COMPLETE 3+V*R*
4 series · 4 of 4 positions shown · non-contrast
Comparison: None.

CLINICAL DATA: Pain following injury

EXAM:
RIGHT WRIST - COMPLETE 3+ VIEW

[wrist pa]
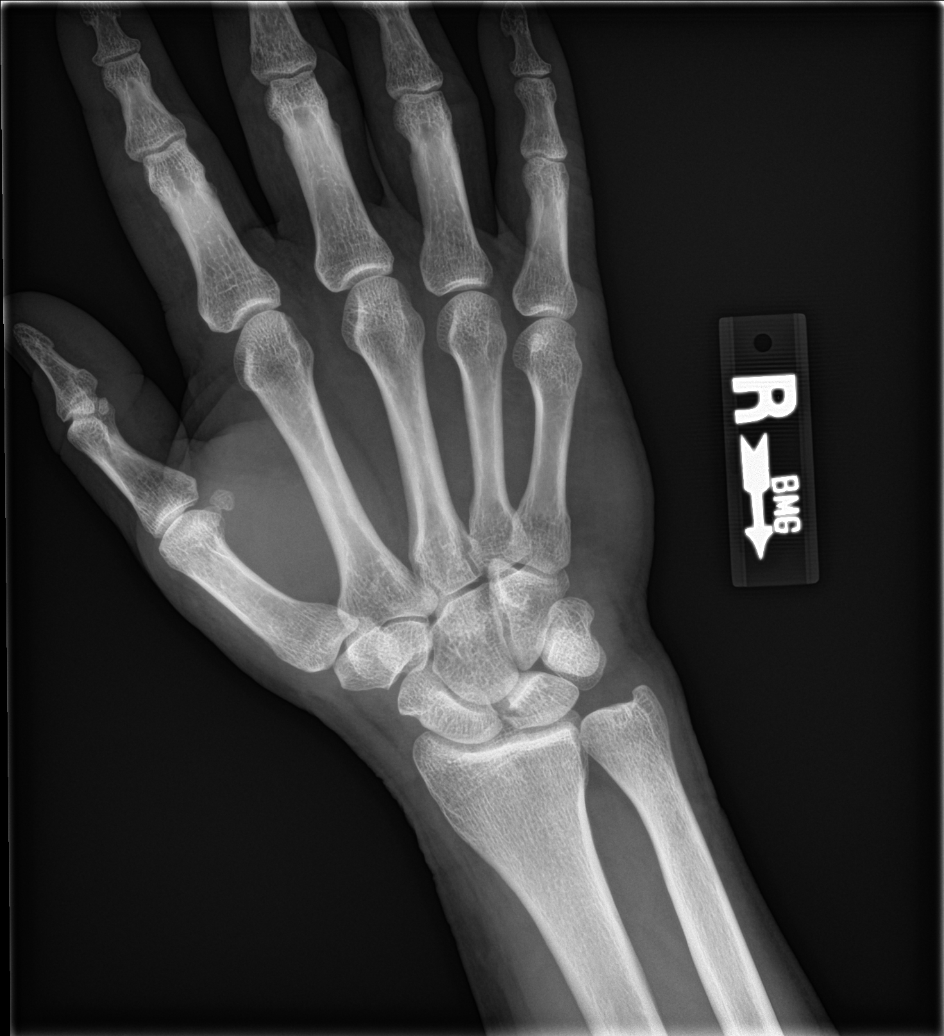

[wrist obl]
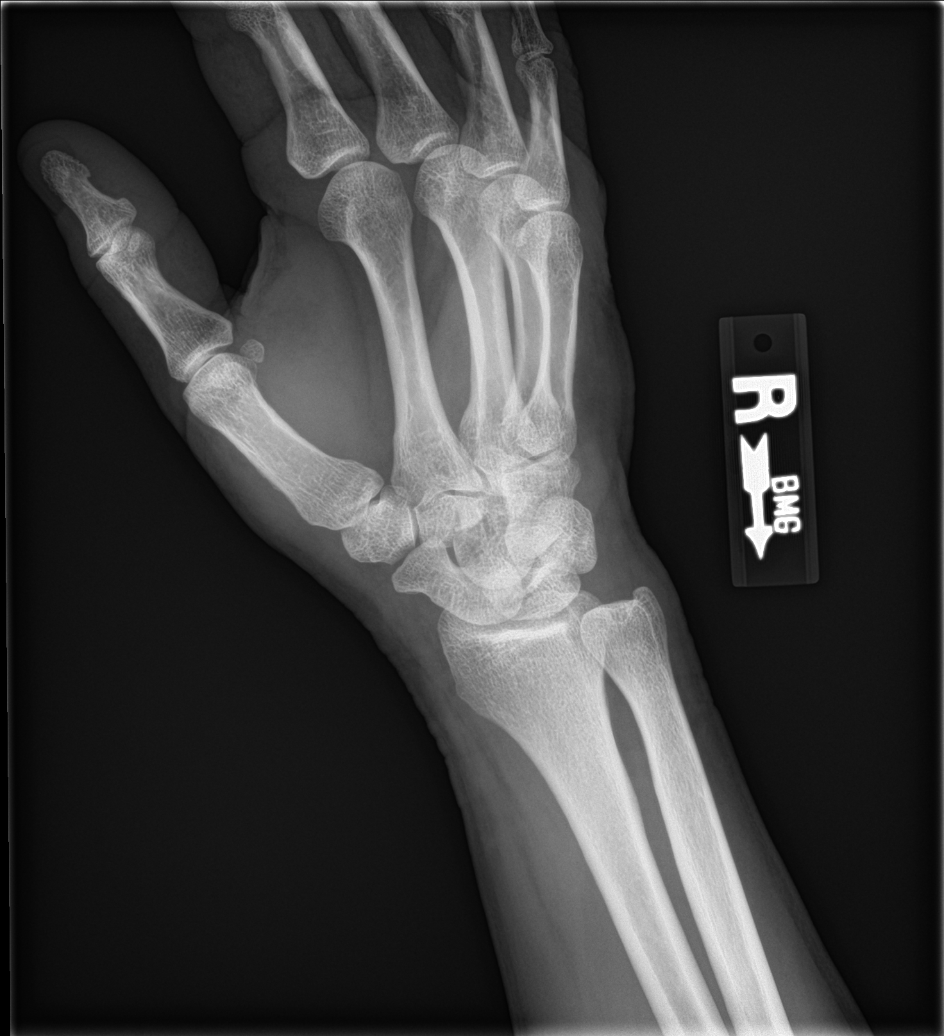

[wrist lat]
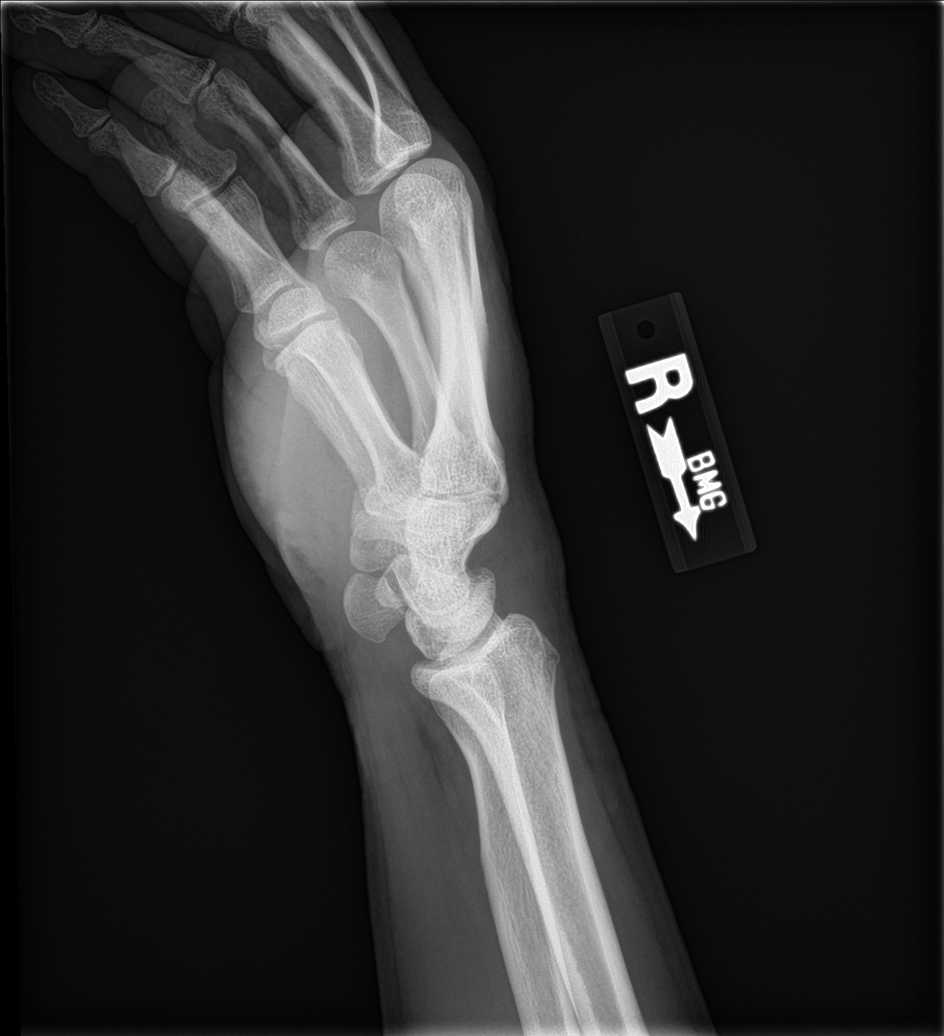

[wrist navicular]
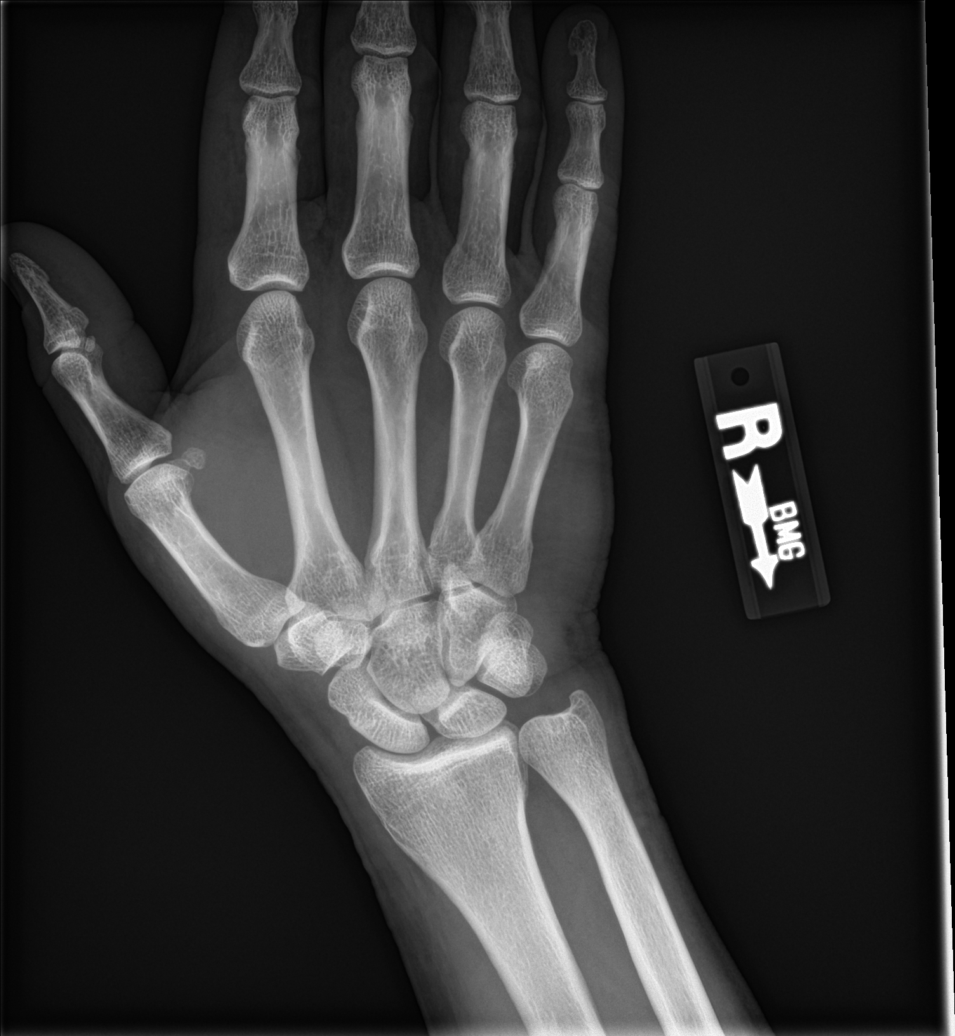

[4 of 4 positions shown; findings below may reference images not displayed]

FINDINGS: Frontal, oblique, lateral, and ulnar deviation scaphoid images were
obtained. There is no fracture or dislocation. Joint spaces appear
normal. No erosive change.
IMPRESSION: No fracture or dislocation.  No evident arthropathy.

## 2019-04-09 ENCOUNTER — Telehealth (INDEPENDENT_AMBULATORY_CARE_PROVIDER_SITE_OTHER): Payer: Self-pay | Admitting: Cardiology

## 2019-04-09 DIAGNOSIS — Z712 Person consulting for explanation of examination or test findings: Secondary | ICD-10-CM

## 2019-04-09 DIAGNOSIS — Z79899 Other long term (current) drug therapy: Secondary | ICD-10-CM

## 2019-04-09 DIAGNOSIS — E782 Mixed hyperlipidemia: Secondary | ICD-10-CM

## 2019-04-09 DIAGNOSIS — Z7182 Exercise counseling: Secondary | ICD-10-CM

## 2019-04-09 DIAGNOSIS — Z7189 Other specified counseling: Secondary | ICD-10-CM

## 2019-04-09 DIAGNOSIS — I480 Paroxysmal atrial fibrillation: Secondary | ICD-10-CM

## 2019-04-09 DIAGNOSIS — R002 Palpitations: Secondary | ICD-10-CM

## 2019-04-09 DIAGNOSIS — Z713 Dietary counseling and surveillance: Secondary | ICD-10-CM

## 2019-04-09 NOTE — Progress Notes (Signed)
Virtual Visit via Video Note   This visit type was conducted due to national recommendations for restrictions regarding the COVID-19 Pandemic (e.g. social distancing) in an effort to limit this patient's exposure and mitigate transmission in our community.  Due to his co-morbid illnesses, this patient is at least at moderate risk for complications without adequate follow up.  This format is felt to be most appropriate for this patient at this time.  All issues noted in this document were discussed and addressed.  A limited physical exam was performed with this format.  Please refer to the patient's chart for his consent to telehealth for Luke Benitez.   Date:  04/09/2019   ID:  Luke Benitez, DOB [birth year], MRN [id number]  Patient Location: Home Provider Location: Home  PCP:  Patient, No Pcp Per  Cardiologist:  Luke Dresser, MD  Electrophysiologist:  None   Evaluation Performed:  Follow-Up Visit  Chief Complaint:  Follow up to discuss test results  History of Present Illness:    Luke Benitez is a 58 y.o. male with PMH paroxysmal atrial fibrillation, recently increasing palpitations.  The patient does not have symptoms concerning for COVID-19 infection (fever, chills, cough, or new shortness of breath).   At our last visit, checked labs. BMET, lipids abnormal. Also discussed monitor vs. KardiaMobile for his palpitations.  Lab review: Cr went from 0.95 (5 years ago) to 1.5, making his GFR 50. Lipids showed Tchol 275, TG 467, HDL 36, LDL 150. He was recommended to establish with PCP and recheck fasting lipids and repeat BMET to make sure these were not just one time abnormalities.  Since last visit, has had fewer palpitations. Has decreased his caffeine intake, which he thinks is helping. There was one night that he felt mild chest pressure while lying down in bed, last several hours, only time it has happened. No associated symptoms at the time.   Reviewed labs today.  He thinks he had cholesterol checked in 2007-2008, similarly was very high nonfasting and better fasting. He weighed 185 at the time. Recommended for weight loss. Trialed statin, felt like he had the flu all the time. Doesn't remember which statin. This was done at [facility], but I cannot find records.  We discussed lipids, guidelines, and management at length today. See below.  Denies shortness of breath at rest or with normal exertion. No PND, orthopnea, LE edema or unexpected weight gain. No syncope.  Past Medical History:  Diagnosis Date  . Allergy   . Ejection fraction    No past surgical history on file.   No outpatient medications have been marked as taking for the 04/09/19 encounter (Telemedicine) with Luke Dresser, MD.     Allergies:   Patient has no known allergies.   Social History   Tobacco Use  . Smoking status: Never Smoker  . Smokeless tobacco: Never Used  Substance Use Topics  . Alcohol use: Yes    Alcohol/week: 4.0 standard drinks    Types: 4 Cans of beer per week  . Drug use: No     Family Hx: The patient's family history includes Cancer in his father and mother; Emphysema in his maternal grandfather; Heart attack in his paternal grandfather; Stroke in his maternal grandmother.  ROS:   Please see the history of present illness.    Constitutional: Negative for chills, fever, night sweats, unintentional weight loss  HENT: Negative for ear pain and hearing loss.   Eyes: Negative for loss of vision and eye  pain.  Respiratory: Negative for cough, sputum, wheezing.   Cardiovascular: See HPI. Gastrointestinal: Negative for abdominal pain, melena, and hematochezia.  Genitourinary: Negative for dysuria and hematuria.  Musculoskeletal: Negative for falls and myalgias.  Skin: Negative for itching and rash.  Neurological: Negative for focal weakness, focal sensory changes and loss of consciousness.  Endo/Heme/Allergies: Does not bruise/bleed  easily.  All other systems reviewed and are negative.   Prior CV studies:   The following studies were reviewed today: No new since last visit  Labs/Other Tests and Data Reviewed:    EKG:  An ECG dated 03/23/19 was personally reviewed today and demonstrated:  NSR, LAFB  Recent Labs: 03/17/2019: BUN 18; Creatinine, Ser 1.50; Hemoglobin 15.3; Magnesium 2.0; Platelets 300; Potassium 4.6; Sodium 137; TSH 2.080   Recent Lipid Panel Lab Results  Component Value Date/Time   CHOL 275 (H) 03/17/2019 11:39 AM   TRIG 467 (H) 03/17/2019 11:39 AM   HDL 36 (L) 03/17/2019 11:39 AM   LDLCALC 150 (H) 03/17/2019 11:39 AM    Wt Readings from Last 3 Encounters:  03/17/19 221 lb (100.2 kg)  06/04/17 217 lb (98.4 kg)  09/21/15 201 lb 9.6 oz (91.4 kg)     Objective:    Vital Signs:  There were no vitals taken for this visit.   VITAL SIGNS:  reviewed GEN:  no acute distress EYES:  sclerae anicteric, EOMI - Extraocular Movements Intact RESPIRATORY:  normal respiratory effort, symmetric expansion CARDIOVASCULAR:  no visible JVD SKIN:  no rash, lesions or ulcers. MUSCULOSKELETAL:  no obvious deformities. NEURO:  alert and oriented x 3, no obvious focal deficit PSYCH:  normal affect  ASSESSMENT & PLAN:    Paroxysmal atrial fib: having palpitations now of unclear etiology. Discussed in depth at last visit. Now improving with decreased caffeine. Monitor. -CHA2DS2/VAS Stroke Risk Points=0, no indication for anticoagulation  Cardiac risk counseling and prevention recommendations: We discussed diet and exercise at length today given his mixed hyperlipidemia:  -recommend heart healthy/Mediterranean diet, with whole grains, fruits, vegetable, fish, lean meats, nuts, and olive oil. Limit salt. -recommend moderate walking, 3-5 times/week for 30-50 minutes each session. Aim for at least 150 minutes.week. Goal should be pace of 3 miles/hours, or walking 1.5 miles in 30 minutes. He does weight training  2x/week for at least an hour that also involves some cardio. -recommend avoidance of tobacco products. Avoid excess alcohol.  -ASCVD risk score: The 10-year ASCVD risk score Denman George DC Montez Hageman., et al., 2013) is: 16.3%   Values used to calculate the score:     Age: 29 years     Sex: Male     Is Non-Hispanic African American: No     Diabetic: No     Tobacco smoker: No     Systolic Blood Pressure: 143 mmHg     Is BP treated: No     HDL Cholesterol: 36 mg/dL     Total Cholesterol: 275 mg/dL  We discussed his elevated LDL, elevated TG, and low HDL at length. He will return for fasting labs. However, we discussed that elevate TG does have risks beyond cardiovascular (including pancreatitis and nonalcoholic fatty liver disease), so if he has high spikes of TGs or long periods with elevated TGs, a normalized fasting number does not mean he has zero risk. I suspect that if his lipids are still very abnormal fasting, we will start a low dose of pravastatin and uptitrate as able. Will need to repeat LFTs/lipids after starting statin. Given concern for  possible liver effect with long standing elevated TG, will check CMET with fasting lipids prior to starting statin.  COVID-19 Education: The signs and symptoms of COVID-19 were discussed with the patient and how to seek care for testing (follow up with PCP or arrange E-visit).  The importance of social distancing was discussed today.  Time:   Today, I have spent 25 minutes with the patient with telehealth technology discussing the above problems.    Patient Instructions  Medication Instructions:  Your Physician recommend you continue on your current medication as directed.   *If you need a refill on your cardiac medications before your next appointment, please call your pharmacy*  Lab Work: Your physician recommends that you return for lab work (Fasting Lipid, BMP)  If you have labs (blood work) drawn today and your tests are completely normal, you will  receive your results only by: Marland Kitchen. MyChart Message (if you have MyChart) OR . A paper copy in the mail If you have any lab test that is abnormal or we need to change your treatment, we will call you to review the results.  Testing/Procedures: None  Follow-Up: At [facility], you and your health needs are our priority.  As part of our continuing mission to provide you with exceptional heart care, we have created designated Provider Care Teams.  These Care Teams include your primary Cardiologist (physician) and Advanced Practice Providers (APPs -  Physician Assistants and Nurse Practitioners) who all work together to provide you with the care you need, when you need it.  Your next appointment:   3  month(s)  The format for your next appointment:   In Person  Provider:   Jodelle RedBridgette Minervia Osso, MD      Signed, Jodelle RedBridgette Coila Wardell, MD  04/09/2019 12:03 PM    [facility]

## 2019-04-09 NOTE — Patient Instructions (Signed)
Medication Instructions:  Your Physician recommend you continue on your current medication as directed.   *If you need a refill on your cardiac medications before your next appointment, please call your pharmacy*  Lab Work: Your physician recommends that you return for lab work (Fasting Lipid, BMP)  If you have labs (blood work) drawn today and your tests are completely normal, you will receive your results only by: Marland Kitchen MyChart Message (if you have MyChart) OR . A paper copy in the mail If you have any lab test that is abnormal or we need to change your treatment, we will call you to review the results.  Testing/Procedures: None  Follow-Up: At [facility] you and your health needs are our priority.  As part of our continuing mission to provide you with exceptional heart care, we have created designated Provider Care Teams.  These Care Teams include your primary Cardiologist (physician) and Advanced Practice Providers (APPs -  Physician Assistants and Nurse Practitioners) who all work together to provide you with the care you need, when you need it.  Your next appointment:   3  month(s)  The format for your next appointment:   In Person  Provider:   Buford Dresser, MD

## 2019-05-25 ENCOUNTER — Other Ambulatory Visit: Payer: Self-pay

## 2019-05-25 DIAGNOSIS — Z79899 Other long term (current) drug therapy: Secondary | ICD-10-CM

## 2019-05-26 ENCOUNTER — Telehealth: Payer: Self-pay | Admitting: Cardiology

## 2019-05-26 ENCOUNTER — Other Ambulatory Visit: Payer: Self-pay

## 2019-05-26 DIAGNOSIS — Z79899 Other long term (current) drug therapy: Secondary | ICD-10-CM

## 2019-05-26 LAB — BASIC METABOLIC PANEL
BUN/Creatinine Ratio: 10 (ref 9–20)
BUN: 11 mg/dL (ref 6–24)
CO2: 25 mmol/L (ref 20–29)
Calcium: 9.5 mg/dL (ref 8.7–10.2)
Chloride: 102 mmol/L (ref 96–106)
Creatinine, Ser: 1.11 mg/dL (ref 0.76–1.27)
GFR calc Af Amer: 84 mL/min/{1.73_m2} (ref >59)
GFR calc non Af Amer: 72 mL/min/{1.73_m2} (ref >59)
Glucose: 97 mg/dL (ref 65–99)
Potassium: 4.9 mmol/L (ref 3.5–5.2)
Sodium: 140 mmol/L (ref 134–144)

## 2019-05-26 LAB — LIPID PANEL
Chol/HDL Ratio: 8.4 ratio — ABNORMAL HIGH (ref 0.0–5.0)
Cholesterol, Total: 287 mg/dL — ABNORMAL HIGH (ref 100–199)
HDL: 34 mg/dL — ABNORMAL LOW (ref >39)
LDL Chol Calc (NIH): 189 mg/dL — ABNORMAL HIGH (ref 0–99)
Triglycerides: 323 mg/dL — ABNORMAL HIGH (ref 0–149)
VLDL Cholesterol Cal: 64 mg/dL — ABNORMAL HIGH (ref 5–40)

## 2019-05-26 MED ORDER — ROSUVASTATIN CALCIUM 10 MG PO TABS: 10.0000 mg | ORAL_TABLET | Freq: Every day | ORAL | 11 refills | Status: DC

## 2019-05-26 NOTE — Telephone Encounter (Signed)
New Message  Patient returning call. Please give patient a call back. 

## 2019-05-26 NOTE — Telephone Encounter (Signed)
Pt updated with lab results and MD's recommendations. Pt verbalized understanding but questioning if MD recommends he check Mg and potassium level. He report he works out daily and drinks coffee and alcohol but because they are both diuretic he doesn't know if he need to use mg and postassim supplements.

## 2019-05-27 NOTE — Telephone Encounter (Signed)
Attempted to contact pt. Unable to leave message as mailbox is full.  °

## 2019-05-27 NOTE — Telephone Encounter (Signed)
His potassium was normal on his kidney function, and we don't routinely supplement magnesium. With normal kidneys, the body usually regulates itself on electrolytes. No reason to routinely take supplements for this.

## 2019-06-01 NOTE — Telephone Encounter (Signed)
Pt updated and voiced he has multiple questions he feel needs to be addressed by MD. Virtual appointment scheduled for 2/11.

## 2019-06-04 ENCOUNTER — Telehealth (INDEPENDENT_AMBULATORY_CARE_PROVIDER_SITE_OTHER): Payer: Self-pay | Admitting: Cardiology

## 2019-06-04 ENCOUNTER — Encounter: Payer: Self-pay | Admitting: Cardiology

## 2019-06-04 DIAGNOSIS — E782 Mixed hyperlipidemia: Secondary | ICD-10-CM

## 2019-06-04 DIAGNOSIS — Z713 Dietary counseling and surveillance: Secondary | ICD-10-CM

## 2019-06-04 DIAGNOSIS — I480 Paroxysmal atrial fibrillation: Secondary | ICD-10-CM

## 2019-06-04 DIAGNOSIS — Z7182 Exercise counseling: Secondary | ICD-10-CM

## 2019-06-04 DIAGNOSIS — Z712 Person consulting for explanation of examination or test findings: Secondary | ICD-10-CM

## 2019-06-04 DIAGNOSIS — Z7189 Other specified counseling: Secondary | ICD-10-CM

## 2019-06-04 NOTE — Progress Notes (Signed)
Virtual Visit via Telephone Note   This visit type was conducted due to national recommendations for restrictions regarding the COVID-19 Pandemic (e.g. social distancing) in an effort to limit this patient's exposure and mitigate transmission in our community.  Due to his co-morbid illnesses, this patient is at least at moderate risk for complications without adequate follow up.  This format is felt to be most appropriate for this patient at this time.  The patient did not have access to video technology/had technical difficulties with video requiring transitioning to audio format only (telephone).  All issues noted in this document were discussed and addressed.  No physical exam could be performed with this format.  Please refer to the patient's chart for his  consent to telehealth for [facility].   Date:  06/04/2019   ID:  Luke Benitez, DOB Jan 20, 1961, MRN [id number]  Patient Location: Home Provider Location: Home  PCP:  Patient, No Pcp Per  Cardiologist:  Jodelle Red, MD  Electrophysiologist:  None   Evaluation Performed:  Follow-Up Visit  Chief Complaint:  Follow up  History of Present Illness:    Luke Benitez is a 59 y.o. male with PMH paroxysmal atrial fibrillation, recently increasing palpitations.  The patient does not have symptoms concerning for COVID-19 infection (fever, chills, cough, or new shortness of breath).   Today: He is most concerned today about his heart rhythm. Wants to review cholesterol numbers as well.  As he has cut back on caffeine, rhythm issues have been less. Almost no caffeine now. Alcohol aggravates his rhythm as well. Has to travel out of town, eats out at [employer], and this triggered his symptoms nearly every time.   We again discussed options for empiric treatment, event monitor, or Kardiamobile. He would prefer to be conservative and not monitor at this time.  Works out 2x/week for an hour every week. Discussed diet and  exercise today. Will focus on increasing salmon, greens. Discussed omega 3 fatty acids specifically. Willing to reduce bacon, whole eggs, and pork chop biscuits and eat more fish/vegetables.   Denies chest pain, shortness of breath at rest or with normal exertion. No PND, orthopnea, LE edema or unexpected weight gain. No syncope or palpitations.   Past Medical History:  Diagnosis Date  . Allergy   . Ejection fraction    No past surgical history on file.   Current Meds  Medication Sig  . rosuvastatin (CRESTOR) 10 MG tablet Take 1 tablet (10 mg total) by mouth daily.     Allergies:   Patient has no known allergies.   Social History   Tobacco Use  . Smoking status: Never Smoker  . Smokeless tobacco: Never Used  Substance Use Topics  . Alcohol use: Yes    Alcohol/week: 4.0 standard drinks    Types: 4 Cans of beer per week  . Drug use: No     Family Hx: The patient's family history includes Cancer in his father and mother; Emphysema in his maternal grandfather; Heart attack in his paternal grandfather; Stroke in his maternal grandmother.  ROS:   Please see the history of present illness.    All other systems reviewed and are negative.   Prior CV studies:   The following studies were reviewed today: No new studies since last visit  Labs/Other Tests and Data Reviewed:    EKG:  An ECG dated 03/17/19 was personally reviewed today and demonstrated:  NSR, LAFB  Recent Labs: 03/17/2019: Hemoglobin 15.3; Magnesium 2.0; Platelets 300; TSH  2.080 05/25/2019: BUN 11; Creatinine, Ser 1.11; Potassium 4.9; Sodium 140   Recent Lipid Panel Lab Results  Component Value Date/Time   CHOL 287 (H) 05/25/2019 10:11 AM   TRIG 323 (H) 05/25/2019 10:11 AM   HDL 34 (L) 05/25/2019 10:11 AM   CHOLHDL 8.4 (H) 05/25/2019 10:11 AM   LDLCALC 189 (H) 05/25/2019 10:11 AM    Wt Readings from Last 3 Encounters:  03/17/19 221 lb (100.2 kg)  06/04/17 217 lb (98.4 kg)  09/21/15 201 lb 9.6 oz (91.4  kg)     Objective:    Vital Signs:  There were no vitals taken for this visit.   Speaking comfortably on the phone, no audible wheezing In no acute distress Alert and oriented Normal affect Normal speech  ASSESSMENT & PLAN:    Palpitations, history of paroxysmal atrial fibrillation -discussed again at length today. Discussed both event monitor and Kardiamobile. He does not wish to pursue at this time, will continue to monitor his symptoms -CHA2DS2/VAS Stroke Risk Points=0 -he will call if symptoms progress. Would recommend monitor at that time -labs unrevealing at prior visit  Mixed hyperlipidemia: Discussed his lipid test results today. LDL very elevated at 189, TG elevated at 323 (fasting) -started rosuvastatin 10 mg. Recheck labs at follow up -diet and exercise counseling done today. He will work on this prior to follow up  CV risk counseling and primary prevention: -recommend heart healthy/Mediterranean diet, with whole grains, fruits, vegetable, fish, lean meats, nuts, and olive oil. Limit salt. -recommend moderate walking, 3-5 times/week for 30-50 minutes each session. Aim for at least 150 minutes.week. Goal should be pace of 3 miles/hours, or walking 1.5 miles in 30 minutes -recommend avoidance of tobacco products. Avoid excess alcohol. -ASCVD risk score: The 10-year ASCVD risk score Mikey Bussing DC Brooke Bonito., et al., 2013) is: 17.6%   Values used to calculate the score:     Age: 59 years     Sex: Male     Is Non-Hispanic African American: No     Diabetic: No     Tobacco smoker: No     Systolic Blood Pressure: 161 mmHg     Is BP treated: No     HDL Cholesterol: 34 mg/dL     Total Cholesterol: 287 mg/dL   COVID-19 Education: The signs and symptoms of COVID-19 were discussed with the patient and how to seek care for testing (follow up with PCP or arrange E-visit).  The importance of social distancing was discussed today.  Time:   Today, I have spent 26 minutes with the patient  with telehealth technology discussing the above problems.  Total time including chart review and documentation 36 minutes.  Patient Instructions  Medication Instructions:  Your Physician recommend you continue on your current medication as directed.    *If you need a refill on your cardiac medications before your next appointment, please call your pharmacy*  Lab Work: None  Testing/Procedures: None  Follow-Up: At [facility], you and your health needs are our priority.  As part of our continuing mission to provide you with exceptional heart care, we have created designated Provider Care Teams.  These Care Teams include your primary Cardiologist (physician) and Advanced Practice Providers (APPs -  Physician Assistants and Nurse Practitioners) who all work together to provide you with the care you need, when you need it.  Your next appointment:   2 month(s)  The format for your next appointment:   In Person  Provider:   Buford Dresser,  MD      Signed, Jodelle Red, MD  06/04/2019  [facility]

## 2019-06-04 NOTE — Patient Instructions (Signed)
Medication Instructions:  Your Physician recommend you continue on your current medication as directed.    *If you need a refill on your cardiac medications before your next appointment, please call your pharmacy*  Lab Work: None  Testing/Procedures: None  Follow-Up: At [facility], you and your health needs are our priority.  As part of our continuing mission to provide you with exceptional heart care, we have created designated Provider Care Teams.  These Care Teams include your primary Cardiologist (physician) and Advanced Practice Providers (APPs -  Physician Assistants and Nurse Practitioners) who all work together to provide you with the care you need, when you need it.  Your next appointment:   2 month(s)  The format for your next appointment:   In Person  Provider:   Jodelle Red, MD

## 2019-07-08 ENCOUNTER — Ambulatory Visit (INDEPENDENT_AMBULATORY_CARE_PROVIDER_SITE_OTHER): Payer: Self-pay | Admitting: Cardiology

## 2019-07-08 ENCOUNTER — Other Ambulatory Visit: Payer: Self-pay

## 2019-07-08 ENCOUNTER — Encounter: Payer: Self-pay | Admitting: Cardiology

## 2019-07-08 VITALS — BP 132/88 | HR 79 | Temp 97.2°F | Ht 73.0 in | Wt 228.8 lb

## 2019-07-08 DIAGNOSIS — Z7189 Other specified counseling: Secondary | ICD-10-CM

## 2019-07-08 DIAGNOSIS — R002 Palpitations: Secondary | ICD-10-CM

## 2019-07-08 DIAGNOSIS — I480 Paroxysmal atrial fibrillation: Secondary | ICD-10-CM

## 2019-07-08 DIAGNOSIS — E782 Mixed hyperlipidemia: Secondary | ICD-10-CM

## 2019-07-08 DIAGNOSIS — Z79899 Other long term (current) drug therapy: Secondary | ICD-10-CM

## 2019-07-08 NOTE — Patient Instructions (Signed)
Medication Instructions:  Your Physician recommend you continue on your current medication as directed.    *If you need a refill on your cardiac medications before your next appointment, please call your pharmacy*   Lab Work: Your physician recommends that you return for lab work in 2 weeks ( Lipid, LFT)  If you have labs (blood work) drawn today and your tests are completely normal, you will receive your results only by: Marland Kitchen MyChart Message (if you have MyChart) OR . A paper copy in the mail If you have any lab test that is abnormal or we need to change your treatment, we will call you to review the results.   Testing/Procedures: None   Follow-Up: At [facility], you and your health needs are our priority.  As part of our continuing mission to provide you with exceptional heart care, we have created designated Provider Care Teams.  These Care Teams include your primary Cardiologist (physician) and Advanced Practice Providers (APPs -  Physician Assistants and Nurse Practitioners) who all work together to provide you with the care you need, when you need it.  We recommend signing up for the patient portal called "MyChart".  Sign up information is provided on this After Visit Summary.  MyChart is used to connect with patients for Virtual Visits (Telemedicine).  Patients are able to view lab/test results, encounter notes, upcoming appointments, etc.  Non-urgent messages can be sent to your provider as well.   To learn more about what you can do with MyChart, go to [email or web address].    Your next appointment:   1 year(s)  The format for your next appointment:   In Person  Provider:   Jodelle Red, MD

## 2019-07-08 NOTE — Progress Notes (Signed)
Cardiology Office Note:    Date:  07/08/2019   ID:  Luke Benitez, DOB 1961-02-10, MRN [id number]  PCP:  Patient, No Pcp Per  Cardiologist:  Jodelle Red, MD (last seen by Dr. Myrtis Ser in 2015)  Referring MD: No ref. provider found   CC: follow up  History of Present Illness:    Luke Benitez is a 59 y.o. male with a hx of prior paroxysmal atrial fibrillation who is seen for follow up. I initially saw him 03/17/19 as a new patient (self referred) for the evaluation and management of arrhythmias.  Dr. Myrtis Ser' last note from 2015 reviewed. History of paroxysmal atrial fibrillation. Echo and echo stress from 2015 below, largely normal. Was thought to be associated possibly to caffeine/alcohol use.   Today: Tolerating rosuvastatin well, no issues. Brother, mom, dad all take crestor and tolerate it well. Plans to return in ~2 weeks for labs, fasting discussed.  Palpitations most noticeable when he lays down, especially if his food isn't fully digested. Drinks alcohol only rarely, cut back to decaf coffee as well for about a month. Now has occasional caffeine in the AM, hasn't noticed any worsening in his symptoms. No clear correlations otherwise that he has seen. Hasn't noticed much sensation while awake in the last month.   Still lifting weights without issues. Has made changes in his diet, cut back on egg yolks and red meat, more salmon. Cut back on saturated fats in general. We discussed data on prescription omega 3 fatty acids and indications for it. Discussed lack of evidence for OTC fish oil from a cardiovascular perspective.   Denies chest pain, shortness of breath at rest or with normal exertion. No PND, orthopnea, LE edema or unexpected weight gain. No syncope. Does have silica dust exposure at work, wears a mask, does feel occasional shortness of breath.  He does not have a primary care. We discussed practices in town here and made a referral for him today.   Past Medical  History:  Diagnosis Date  . Allergy   . Ejection fraction     No past surgical history on file.  Current Medications: Current Outpatient Medications on File Prior to Visit  Medication Sig  . rosuvastatin (CRESTOR) 10 MG tablet Take 1 tablet (10 mg total) by mouth daily.   No current facility-administered medications on file prior to visit.     Allergies:   Patient has no known allergies.   Social History   Tobacco Use  . Smoking status: Never Smoker  . Smokeless tobacco: Never Used  Substance Use Topics  . Alcohol use: Yes    Alcohol/week: 4.0 standard drinks    Types: 4 Cans of beer per week  . Drug use: No    Family History: family history includes Cancer in his father and mother; Emphysema in his maternal grandfather; Heart attack in his paternal grandfather; Stroke in his maternal grandmother.  ROS:   Please see the history of present illness.  Additional pertinent ROS otherwise unremarkable.  EKGs/Labs/Other Studies Reviewed:    The following studies were reviewed today: Echo stress 03/26/2014 - Stress ECG conclusions: There were no stress arrhythmias or  conduction abnormalities. The stress ECG was negative for  ischemia.  - Staged echo: There was no echocardiographic evidence for  stress-induced ischemia.   Echo 03/12/2014 - Left ventricle: The cavity size was normal. There was mild focal  basal hypertrophy of the septum. Systolic function was vigorous.  The estimated ejection fraction was in the range of  65% to 70%.  Wall motion was normal; there were no regional wall motion  abnormalities.  - Aortic valve: There was mild regurgitation.   EKG:  EKG is personally reviewed.  The ekg ordered 03/23/19 demonstrates NSR at 90 bpm with LAFB  Recent Labs: 03/17/2019: Hemoglobin 15.3; Magnesium 2.0; Platelets 300; TSH 2.080 05/25/2019: BUN 11; Creatinine, Ser 1.11; Potassium 4.9; Sodium 140  Recent Lipid Panel    Component Value Date/Time   CHOL  287 (H) 05/25/2019 1011   TRIG 323 (H) 05/25/2019 1011   HDL 34 (L) 05/25/2019 1011   CHOLHDL 8.4 (H) 05/25/2019 1011   LDLCALC 189 (H) 05/25/2019 1011    Physical Exam:    VS:  BP 132/88   Pulse 79   Temp (!) 97.2 F (36.2 C)   Ht 6\' 1"  (1.854 m)   Wt 228 lb 12.8 oz (103.8 kg)   SpO2 97%   BMI 30.19 kg/m     Wt Readings from Last 3 Encounters:  07/08/19 228 lb 12.8 oz (103.8 kg)  03/17/19 221 lb (100.2 kg)  06/04/17 217 lb (98.4 kg)    GEN: Well nourished, well developed in no acute distress HEENT: Normal, moist mucous membranes NECK: No JVD CARDIAC: regular rhythm, normal S1 and S2, no rubs or gallops. No murmur. VASCULAR: Radial and DP pulses 2+ bilaterally. No carotid bruits RESPIRATORY:  Clear to auscultation without rales, wheezing or rhonchi  ABDOMEN: Soft, non-tender, non-distended MUSCULOSKELETAL:  Ambulates independently SKIN: Warm and dry, no edema NEUROLOGIC:  Alert and oriented x 3. No focal neuro deficits noted. PSYCHIATRIC:  Normal affect   ASSESSMENT:    1. Paroxysmal atrial fibrillation (HCC)   2. Mixed hyperlipidemia   3. Cardiac risk counseling   4. Counseling on health promotion and disease prevention   5. Medication management   6. Heart palpitations    PLAN:    Palpitations, history of paroxysmal atrial fibrillation -we have discussed Zio event monitor vs. [employer] in the past. Will continue with symptom monitoring -CHA2DS2/VAS Stroke Risk Points=0 -he will call if symptoms progress. Would recommend monitor at that time  Mixed hyperlipidemia: -started rosuvastatin 10 mg in 05/2019. Recheck labs fasting in the next several weeks, ordered for medication management -diet and exercise counseling reviewed today. He is doing well with this.  CV risk counseling and primary prevention: -recommend heart healthy/Mediterranean diet, with whole grains, fruits, vegetable, fish, lean meats, nuts, and olive oil. Limit salt. -recommend moderate  walking, 3-5 times/week for 30-50 minutes each session. Aim for at least 150 minutes.week. Goal should be pace of 3 miles/hours, or walking 1.5 miles in 30 minutes -recommend avoidance of tobacco products. Avoid excess alcohol. -ASCVD risk score: The 10-year ASCVD risk score Mikey Bussing DC Brooke Bonito., et al., 2013) is: 15.5%   Values used to calculate the score:     Age: 59 years     Sex: Male     Is Non-Hispanic African American: No     Diabetic: No     Tobacco smoker: No     Systolic Blood Pressure: 193 mmHg     Is BP treated: No     HDL Cholesterol: 34 mg/dL     Total Cholesterol: 287 mg/dL   Follow up in 1 year or sooner as needed.  Medication Adjustments/Labs and Tests Ordered: Current medicines are reviewed at length with the patient today.  Concerns regarding medicines are outlined above.  Orders Placed This Encounter  Procedures  . Ambulatory referral to Internal Medicine  No orders of the defined types were placed in this encounter.   Patient Instructions  Medication Instructions:  Your Physician recommend you continue on your current medication as directed.    *If you need a refill on your cardiac medications before your next appointment, please call your pharmacy*   Lab Work: Your physician recommends that you return for lab work in 2 weeks ( Lipid, LFT)  If you have labs (blood work) drawn today and your tests are completely normal, you will receive your results only by: Marland Kitchen MyChart Message (if you have MyChart) OR . A paper copy in the mail If you have any lab test that is abnormal or we need to change your treatment, we will call you to review the results.   Testing/Procedures: None   Follow-Up: At [facility], you and your health needs are our priority.  As part of our continuing mission to provide you with exceptional heart care, we have created designated Provider Care Teams.  These Care Teams include your primary Cardiologist (physician) and Advanced Practice  Providers (APPs -  Physician Assistants and Nurse Practitioners) who all work together to provide you with the care you need, when you need it.  We recommend signing up for the patient portal called "MyChart".  Sign up information is provided on this After Visit Summary.  MyChart is used to connect with patients for Virtual Visits (Telemedicine).  Patients are able to view lab/test results, encounter notes, upcoming appointments, etc.  Non-urgent messages can be sent to your provider as well.   To learn more about what you can do with MyChart, go to [email or web address].    Your next appointment:   1 year(s)  The format for your next appointment:   In Person  Provider:   Jodelle Red, MD      Signed, Jodelle Red, MD PhD 07/08/2019  [facility]

## 2019-07-09 ENCOUNTER — Encounter: Payer: Self-pay | Admitting: Cardiology

## 2019-11-04 ENCOUNTER — Telehealth: Payer: Self-pay | Admitting: Cardiology

## 2019-11-04 NOTE — Telephone Encounter (Signed)
LMTCB regarding the patient requesting a refill on his Crestor.    It looks as though this medication order is expired. Will route to Dr. Cristal Deer to see if they would like to refill.

## 2019-11-04 NOTE — Telephone Encounter (Signed)
New message   Patient wants to see if he can get a 20 mg of   rosuvastatin (CRESTOR) 10 MG tablet(Expired) so that he can cut in half. He needs a new prescription.    Sent to [facility] - [place], [place] - [address]

## 2019-11-05 MED ORDER — ROSUVASTATIN CALCIUM 20 MG PO TABS: 10.0000 mg | ORAL_TABLET | Freq: Every day | ORAL | 3 refills | Status: DC

## 2019-11-05 NOTE — Telephone Encounter (Signed)
Spoke to pt who voiced he needed a new Rx for crestor 10 mg. Pt requesting an order for Crestor 20 mg and he will only take 1/2 tablet. Pt voiced it cheaper with splitting tablet.   New Rx sent to the requested pharmacy.

## 2019-11-05 NOTE — Telephone Encounter (Signed)
Follow up   Pt calling back to follow up, he got the message from [facility], he said he needs at least another month refill so it will be consistent on his next blood work

## 2019-11-05 NOTE — Addendum Note (Signed)
Addended by: Parke Poisson on: 11/05/2019 10:03 AM   Modules accepted: Orders

## 2020-07-13 ENCOUNTER — Telehealth: Payer: Self-pay | Admitting: Cardiology

## 2020-07-13 NOTE — Telephone Encounter (Signed)
3.23.22 LM to schedule 1 yr fu w/Dr Jodelle Red. LP

## 2020-07-20 ENCOUNTER — Telehealth: Payer: Self-pay | Admitting: Cardiology

## 2020-07-20 NOTE — Telephone Encounter (Signed)
3.30.22 LVM to schedule 1 yr fu w/Dr. Cristal Deer. LP

## 2021-06-16 ENCOUNTER — Emergency Department (HOSPITAL_BASED_OUTPATIENT_CLINIC_OR_DEPARTMENT_OTHER): Payer: Self-pay | Admitting: Radiology

## 2021-06-16 ENCOUNTER — Other Ambulatory Visit: Payer: Self-pay

## 2021-06-16 ENCOUNTER — Emergency Department (HOSPITAL_BASED_OUTPATIENT_CLINIC_OR_DEPARTMENT_OTHER)
Admission: EM | Admit: 2021-06-16 | Discharge: 2021-06-16 | Disposition: A | Discharge status: Home / Self Care | Payer: Self-pay | Attending: Emergency Medicine | Admitting: Emergency Medicine

## 2021-06-16 ENCOUNTER — Encounter (HOSPITAL_BASED_OUTPATIENT_CLINIC_OR_DEPARTMENT_OTHER): Payer: Self-pay | Admitting: *Deleted

## 2021-06-16 DIAGNOSIS — R002 Palpitations: Secondary | ICD-10-CM | POA: Insufficient documentation

## 2021-06-16 DIAGNOSIS — R42 Dizziness and giddiness: Secondary | ICD-10-CM | POA: Insufficient documentation

## 2021-06-16 LAB — BASIC METABOLIC PANEL
Anion gap: 8 (ref 5–15)
BUN: 19 mg/dL (ref 8–23)
CO2: 23 mmol/L (ref 22–32)
Calcium: 9.7 mg/dL (ref 8.9–10.3)
Chloride: 106 mmol/L (ref 98–111)
Creatinine, Ser: 1.1 mg/dL (ref 0.61–1.24)
GFR, Estimated: >60 mL/min (ref >60)
Glucose, Bld: 111 mg/dL — ABNORMAL HIGH (ref 70–99)
Potassium: 4.3 mmol/L (ref 3.5–5.1)
Sodium: 137 mmol/L (ref 135–145)

## 2021-06-16 LAB — CBC
HCT: 44.7 % (ref 39.0–52.0)
Hemoglobin: 14.9 g/dL (ref 13.0–17.0)
MCH: 29.6 pg (ref 26.0–34.0)
MCHC: 33.3 g/dL (ref 30.0–36.0)
MCV: 88.9 fL (ref 80.0–100.0)
Platelets: 280 10*3/uL (ref 150–400)
RBC: 5.03 MIL/uL (ref 4.22–5.81)
RDW: 13.1 % (ref 11.5–15.5)
WBC: 6 10*3/uL (ref 4.0–10.5)
nRBC: 0 % (ref 0.0–0.2)

## 2021-06-16 LAB — MAGNESIUM: Magnesium: 2 mg/dL (ref 1.7–2.4)

## 2021-06-16 LAB — TROPONIN I (HIGH SENSITIVITY): Troponin I (High Sensitivity): 3 ng/L (ref <18)

## 2021-06-16 NOTE — ED Notes (Signed)
Dc instructions reviewed with pt. Pt will follow up with cardiology no questions or concerns at this time.

## 2021-06-16 NOTE — ED Triage Notes (Addendum)
Pt had palpitations last pm, he called ems and HR was 130 and he was able to lower vagal maneuver and HR was 105 after and pt felt better.  No CP or sob with this.  Pt does note he had some dizziness with the palpitations. Pt did not transport to the hospital at that time.  Pt is here today as he would like to know if this was anxiety vs cardiac last pm.

## 2021-06-16 NOTE — ED Provider Notes (Signed)
[facility] EMERGENCY DEPT Provider Note   CSN: [id number] Arrival date & time: 06/16/21  0944     History  Chief Complaint  Patient presents with   Follow-up    Luke Benitez is a 61 y.o. male.  Patient is a 61 year old male with a prior history of hyperlipidemia and paroxysmal atrial fibrillation with a CHA2DS2-VASc score of 0 and never on anticoagulation who is presenting today with complaint of palpitations and dizziness that occurred yesterday.  He reports this week has been stressful with a lot going on.  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  By 3 or 4:00 in the afternoon he just started feeling lightheaded and not himself.  This lasted further into the day and he decided to go lay down which is when he noticed that his heart was beating really fast.  When he checked his pulse at home it was 130.  It was not any better with laying down so he called EMS.  When EMS got there they did note that his heart rate was fast and he reports they took him to the truck made him blow through a syringe and his heart rate slowed down.  He has EMS EKGs with him that showed sinus tachycardia.  He was coming today just because they recommended evaluation.  He reports he feels completely fine today.  At no time was he having any chest pain or shortness of breath.  He takes no medications.  He does not use drugs, tobacco and drinks alcohol occassionally  The history is provided by the patient and medical records.      Home Medications Prior to Admission medications   Medication Sig Start Date End Date Taking? Authorizing Provider  rosuvastatin (CRESTOR) 20 MG tablet Take 0.5 tablets (10 mg  total) by mouth daily. 11/05/19 02/03/20  Jodelle Red, MD      Allergies    Patient has no known allergies.    Review of Systems   Review of Systems  Physical Exam Updated Vital Signs BP 135/81    Pulse 70    Temp 98.3 F (36.8 C) (Oral)    Resp 16    Wt 100.7 kg    SpO2 94%    BMI 29.29 kg/m  Physical Exam Vitals and nursing note reviewed.  Constitutional:      General: He is not in acute distress.    Appearance: He is well-developed.  HENT:     Head: Normocephalic and atraumatic.  Eyes:     Conjunctiva/sclera: Conjunctivae normal.     Pupils: Pupils are equal, round, and reactive to light.  Cardiovascular:     Rate and Rhythm: Normal rate and regular rhythm.     Heart sounds: No murmur heard. Pulmonary:     Effort: Pulmonary effort is normal. No respiratory distress.     Breath sounds: Normal breath sounds. No wheezing or rales.  Abdominal:     General: There is no distension.     Palpations: Abdomen is soft.     Tenderness: There is no abdominal tenderness. There is no guarding or rebound.  Musculoskeletal:        General: No tenderness. Normal  range of motion.     Cervical back: Normal range of motion and neck supple.  Skin:    General: Skin is warm and dry.     Findings: No erythema or rash.  Neurological:     Mental Status: He is alert and oriented to person, place, and time.  Psychiatric:        Behavior: Behavior normal.    ED Results / Procedures / Treatments   Labs (all labs ordered are listed, but only abnormal results are displayed) Labs Reviewed  BASIC METABOLIC PANEL - Abnormal; Notable for the following components:      Result Value   Glucose, Bld 111 (*)    All other components within normal limits  CBC  MAGNESIUM  TROPONIN I (HIGH SENSITIVITY)  TROPONIN I (HIGH SENSITIVITY)    EKG EKG Interpretation  Date/Time:  Friday June 16 2021 10:12:22 EST Ventricular Rate:  80 PR Interval:  150 QRS Duration: 102 QT  Interval:  358 QTC Calculation: 412 R Axis:   -65 Text Interpretation: Normal sinus rhythm Incomplete right bundle branch block Left anterior fascicular block Minimal voltage criteria for LVH, may be normal variant ( Cornell product ) No significant change since last tracing When compared with ECG of 12-Mar-2014 13:34, PREVIOUS ECG IS PRESENT Confirmed by Gwyneth Sprout ([id number]) on 06/16/2021 10:33:31 AM  Radiology DG Chest 2 View  Result Date: 06/16/2021 CLINICAL DATA:  Heart palpitations beginning last night. EXAM: CHEST - 2 VIEW COMPARISON:  None. FINDINGS: The heart size and mediastinal contours are within normal limits. Both lungs are clear. The visualized skeletal structures are unremarkable. IMPRESSION: No active cardiopulmonary disease. Electronically Signed   By: Elberta Fortis M.D.   On: 06/16/2021 10:58    Procedures Procedures    Medications Ordered in ED Medications - No data to display  ED Course/ Medical Decision Making/ A&P                           Medical Decision Making Amount and/or Complexity of Data Reviewed Labs: ordered. Radiology: ordered.   Patient presenting today after having palpitations yesterday with a history of paroxysmal atrial fibrillation.  Unclear what rhythm patient was in yesterday.  I reviewed patient's EKGs from EMS which just showed sinus tachycardia.  I independently interpreted patient's EKG and labs today and his EKG is within normal limits and labs with normal CBC CMP, magnesium and troponin.  I independently viewed and interpreted chest x-ray which was normal.  Concerned that patient may have had a round of paroxysmal atrial fibrillation versus potentially SVT versus sinus tachycardia from additional caffeine yesterday afternoon.  No acute abnormalities at this time and patient is appropriate for discharge home.  No indication for admission.  However did discuss following up with cardiology especially if he continues to have symptoms as he will  most likely need to wear a monitor.  External medical records from his prior cardiology visits were reviewed.  No social determinants affecting his discharge today.        Final Clinical Impression(s) / ED Diagnoses Final diagnoses:  Palpitations    Rx / DC Orders ED Discharge Orders     None         Gwyneth Sprout, MD 06/16/21 1202

## 2021-06-16 NOTE — Discharge Instructions (Signed)
Avoid excessive caffeine, making sure you are getting plenty of rest and staying hydrated.  All the lab work, EKG and x-ray look normal today.

## 2021-06-26 ENCOUNTER — Emergency Department (HOSPITAL_COMMUNITY)
Admission: EM | Admit: 2021-06-26 | Discharge: 2021-06-26 | Disposition: A | Discharge status: Home / Self Care | Payer: Self-pay | Attending: Student | Admitting: Student

## 2021-06-26 ENCOUNTER — Emergency Department (HOSPITAL_COMMUNITY): Payer: Self-pay

## 2021-06-26 ENCOUNTER — Other Ambulatory Visit: Payer: Self-pay

## 2021-06-26 ENCOUNTER — Encounter (HOSPITAL_COMMUNITY): Payer: Self-pay

## 2021-06-26 DIAGNOSIS — F1090 Alcohol use, unspecified, uncomplicated: Secondary | ICD-10-CM | POA: Insufficient documentation

## 2021-06-26 DIAGNOSIS — R7989 Other specified abnormal findings of blood chemistry: Secondary | ICD-10-CM | POA: Insufficient documentation

## 2021-06-26 DIAGNOSIS — D72829 Elevated white blood cell count, unspecified: Secondary | ICD-10-CM | POA: Insufficient documentation

## 2021-06-26 DIAGNOSIS — R4182 Altered mental status, unspecified: Secondary | ICD-10-CM | POA: Insufficient documentation

## 2021-06-26 DIAGNOSIS — R000 Tachycardia, unspecified: Secondary | ICD-10-CM | POA: Insufficient documentation

## 2021-06-26 DIAGNOSIS — Z79899 Other long term (current) drug therapy: Secondary | ICD-10-CM | POA: Insufficient documentation

## 2021-06-26 DIAGNOSIS — R002 Palpitations: Secondary | ICD-10-CM | POA: Insufficient documentation

## 2021-06-26 LAB — CBC WITH DIFFERENTIAL/PLATELET
Abs Immature Granulocytes: 0.04 10*3/uL (ref 0.00–0.07)
Basophils Absolute: 0 10*3/uL (ref 0.0–0.1)
Basophils Relative: 0 %
Eosinophils Absolute: 0.1 10*3/uL (ref 0.0–0.5)
Eosinophils Relative: 1 %
HCT: 42.5 % (ref 39.0–52.0)
Hemoglobin: 14.3 g/dL (ref 13.0–17.0)
Immature Granulocytes: 0 %
Lymphocytes Relative: 31 %
Lymphs Abs: 3.3 10*3/uL (ref 0.7–4.0)
MCH: 30.7 pg (ref 26.0–34.0)
MCHC: 33.6 g/dL (ref 30.0–36.0)
MCV: 91.2 fL (ref 80.0–100.0)
Monocytes Absolute: 0.9 10*3/uL (ref 0.1–1.0)
Monocytes Relative: 9 %
Neutro Abs: 6.3 10*3/uL (ref 1.7–7.7)
Neutrophils Relative %: 59 %
Platelets: 289 10*3/uL (ref 150–400)
RBC: 4.66 MIL/uL (ref 4.22–5.81)
RDW: 12.6 % (ref 11.5–15.5)
WBC: 10.7 10*3/uL — ABNORMAL HIGH (ref 4.0–10.5)
nRBC: 0 % (ref 0.0–0.2)

## 2021-06-26 LAB — COMPREHENSIVE METABOLIC PANEL
ALT: 34 U/L (ref 0–44)
AST: 25 U/L (ref 15–41)
Albumin: 4.1 g/dL (ref 3.5–5.0)
Alkaline Phosphatase: 66 U/L (ref 38–126)
Anion gap: 10 (ref 5–15)
BUN: 20 mg/dL (ref 8–23)
CO2: 23 mmol/L (ref 22–32)
Calcium: 9.1 mg/dL (ref 8.9–10.3)
Chloride: 102 mmol/L (ref 98–111)
Creatinine, Ser: 1.32 mg/dL — ABNORMAL HIGH (ref 0.61–1.24)
GFR, Estimated: >60 mL/min (ref >60)
Glucose, Bld: 191 mg/dL — ABNORMAL HIGH (ref 70–99)
Potassium: 3.8 mmol/L (ref 3.5–5.1)
Sodium: 135 mmol/L (ref 135–145)
Total Bilirubin: 0.5 mg/dL (ref 0.3–1.2)
Total Protein: 6.9 g/dL (ref 6.5–8.1)

## 2021-06-26 LAB — RAPID URINE DRUG SCREEN, HOSP PERFORMED
Amphetamines: NOT DETECTED
Barbiturates: NOT DETECTED
Benzodiazepines: NOT DETECTED
Cocaine: NOT DETECTED
Opiates: NOT DETECTED
Tetrahydrocannabinol: POSITIVE — AB

## 2021-06-26 LAB — TSH: TSH: 2.334 u[IU]/mL (ref 0.350–4.500)

## 2021-06-26 LAB — MAGNESIUM: Magnesium: 1.8 mg/dL (ref 1.7–2.4)

## 2021-06-26 LAB — CBG MONITORING, ED: Glucose-Capillary: 169 mg/dL — ABNORMAL HIGH (ref 70–99)

## 2021-06-26 NOTE — ED Provider Notes (Signed)
[facility] EMERGENCY DEPARTMENT Provider Note   CSN: [id number] Arrival date & time: 06/26/21  4650     History  Chief Complaint  Patient presents with   Palpitations    Luke Benitez is a 61 y.o. male with a history of paroxysmal atrial fibrillation who presents to the emergency department with complaints of palpitations that began approximately 2 hours prior to arrival.  Patient states that he is having palpitations as if his heart is racing with associated "feeling out of it".  He states that he feels he is having some trouble processing information at times and that his entire bilaterally feels somewhat tingly/tight but not necessarily painful with dry mouth.  No leaving aggravating factors to his symptoms.  No intervention prior to arrival.  He did have some similar palpitations and feeling out of it a couple of weeks ago and had a normal EKG at that time, he also had some similar palpitations 8 years ago which she was told was A-fib.  He got lab work the day following his palpitations a couple of weeks ago which was normal.  He denies chest pain, shortness of breath, syncope, unilateral numbness/weakness, slurred speech, facial droop, seizure activity, or recent head trauma.  Reports occasional alcohol use, none today. States he has been using CBD oil and used some tonight and the last time he had palpitations. Reports he has been eating and drinking normally.  HPI     Home Medications Prior to Admission medications   Medication Sig Start Date End Date Taking? Authorizing Provider  rosuvastatin (CRESTOR) 20 MG tablet Take 0.5 tablets (10 mg total) by mouth daily. 11/05/19 02/03/20  Jodelle Red, MD      Allergies    Patient has no known allergies.    Review of Systems   Review of Systems  Constitutional:  Negative for chills and fever.  Respiratory:  Negative for cough and shortness of breath.   Cardiovascular:  Positive for palpitations. Negative  for chest pain and leg swelling.  Gastrointestinal:  Negative for abdominal pain and vomiting.  Genitourinary:  Negative for dysuria.  Neurological:  Negative for seizures, syncope, weakness, numbness and headaches.       Positive for feeling out of it with generalized paresthesias.   All other systems reviewed and are negative.  Physical Exam Updated Vital Signs BP (!) 166/91    Pulse 100    Temp 97.8 F (36.6 C) (Oral)    Resp 11    Ht 6\' 1"  (1.854 m)    Wt 99.8 kg    SpO2 99%    BMI 29.03 kg/m  Physical Exam Vitals and nursing note reviewed.  Constitutional:      General: He is not in acute distress.    Appearance: Normal appearance. He is not toxic-appearing.  HENT:     Head: Normocephalic and atraumatic.     Mouth/Throat:     Pharynx: Oropharynx is clear. Uvula midline.  Eyes:     General: Vision grossly intact. Gaze aligned appropriately.     Extraocular Movements: Extraocular movements intact.     Conjunctiva/sclera: Conjunctivae normal.     Pupils: Pupils are equal, round, and reactive to light.     Comments: No proptosis.   Cardiovascular:     Rate and Rhythm: Regular rhythm. Tachycardia present.  Pulmonary:     Effort: Pulmonary effort is normal.     Breath sounds: Normal breath sounds.  Abdominal:     General: There is no  distension.     Palpations: Abdomen is soft.     Tenderness: There is no abdominal tenderness. There is no guarding or rebound.  Musculoskeletal:     Cervical back: Normal range of motion and neck supple. No rigidity.     Right lower leg: No edema.     Left lower leg: No edema.  Skin:    General: Skin is warm and dry.  Neurological:     Mental Status: He is alert.     Comments: Alert. Clear speech. No facial droop. CNIII-XII grossly intact. Bilateral upper and lower extremities' sensation grossly intact. 5/5 symmetric strength with grip strength and with plantar and dorsi flexion bilaterally . Normal finger to nose bilaterally. Negative  pronator drift. Gait intact.    Psychiatric:        Mood and Affect: Mood normal.        Behavior: Behavior normal.    ED Results / Procedures / Treatments   Labs (all labs ordered are listed, but only abnormal results are displayed) Labs Reviewed  COMPREHENSIVE METABOLIC PANEL - Abnormal; Notable for the following components:      Result Value   Glucose, Bld 191 (*)    Creatinine, Ser 1.32 (*)    All other components within normal limits  CBC WITH DIFFERENTIAL/PLATELET - Abnormal; Notable for the following components:   WBC 10.7 (*)    All other components within normal limits  RAPID URINE DRUG SCREEN, HOSP PERFORMED - Abnormal; Notable for the following components:   Tetrahydrocannabinol POSITIVE (*)    All other components within normal limits  CBG MONITORING, ED - Abnormal; Notable for the following components:   Glucose-Capillary 169 (*)    All other components within normal limits  TSH  MAGNESIUM    EKG None  Radiology CT Head Wo Contrast  Result Date: 06/26/2021 CLINICAL DATA:  Altered mental status and tachycardia EXAM: CT HEAD WITHOUT CONTRAST TECHNIQUE: Contiguous axial images were obtained from the base of the skull through the vertex without intravenous contrast. RADIATION DOSE REDUCTION: This exam was performed according to the departmental dose-optimization program which includes automated exposure control, adjustment of the mA and/or kV according to patient size and/or use of iterative reconstruction technique. COMPARISON:  None. FINDINGS: Brain: No evidence of acute infarction, hemorrhage, hydrocephalus, extra-axial collection or mass lesion/mass effect. Vascular: No hyperdense vessel or unexpected calcification. Skull: Normal. Negative for fracture or focal lesion. Sinuses/Orbits: No acute finding. Other: None. IMPRESSION: No acute intracranial abnormality noted. Electronically Signed   By: Alcide Clever M.D.   On: 06/26/2021 03:08    Procedures Procedures     Medications Ordered in ED Medications - No data to display  ED Course/ Medical Decision Making/ A&P                           Medical Decision Making Amount and/or Complexity of Data Reviewed Labs: ordered. Radiology: ordered.   Patient presents to the ED with complaints of palpitations and feeling out of it, this involves an extensive number of treatment options, and is a complaint that carries with it a high risk of complications and morbidity. Nontoxic, vitals w/ mild tachycardia at times.   Additional history obtained:  Chart/nursing note review including prior ED visits.   EKG: Sinus rhythm, no STEMI, not in afib.   Lab Tests:  I reviewed & interpreted labs including:  CBC: mild leukocytosis felt to be nonspecific.  CMP: No significant electrolyte derangement,  creatinine mildly elevated.  TSH: WNL Magnesium: WNL UDS: Positive for THC.   Imaging Studies ordered:  I ordered and viewed the following imaging, agree with radiologist impression:  CT head wo: No acute intracranial abnormality noted.   ED Course:  Patient placed on cardiac monitoring in the emergency department, has remained in sinus rhythm.  Work-up was without significant electrolyte derangement, critical anemia, or thyroid dysfunction.  CT head without bleed or mass.  No focal neurologic deficits on exam.  No complaints of chest pain with his symptoms.  UDS positive for THC.  Patient has been utilizing CBD oil, advise he discontinue this as this may be contributing to his symptoms.  He is feeling improved in the emergency department.  He overall seems reasonable for discharge home with outpatient primary care follow-up.  We will also provide information for neurology and cardiology for follow-up. We discussed results, treatment plan, need for follow-up, and return precautions with the patient. Provided opportunity for questions, patient confirmed understanding and is in agreement with plan.    This is a  shared visit with supervising physician Dr. Posey Rea who has independently evaluated patient & provided guidance in evaluation/management/disposition, in agreement with care   Portions of this note were generated with Dragon dictation software. Dictation errors may occur despite best attempts at proofreading.  Final Clinical Impression(s) / ED Diagnoses Final diagnoses:  Palpitations    Rx / DC Orders ED Discharge Orders     None         Cherly Anderson, PA-C 06/26/21 0355    Glendora Score, MD 06/26/21 [phone]

## 2021-06-26 NOTE — ED Notes (Signed)
Pt discharged and ambulated out of the ED without difficulty. 

## 2021-06-26 NOTE — Discharge Instructions (Addendum)
You were seen in the emergency department today for palpitations.  Your work-up was overall reassuring.  Your creatinine which looks at kidney function was mildly elevated, please have this rechecked by your primary care provider. ? ?The remainder of your work-up was reassuring.  Your head CT did not show any significant abnormalities.  Please discontinue use of CBD oil.  Please follow with your primary care provider soon as possible.  We have also provided information for cardiology and neurology follow-up.  Return to the ER for new or worsening symptoms including but not limited to new worsening pain, chest pain, trouble breathing, passing out, numbness/weakness to one side of your body, trouble with your speech, change in your vision, or any other concerns. ? ? ?

## 2021-06-26 NOTE — ED Triage Notes (Signed)
Pt BIB [facility] from home after feeling like his heart is racing and nauseated x1 hour. Pt has h/x similar symptoms in February. VSS. Ambulatory to stretcher. No CP or SHOB. ?

## 2021-08-15 ENCOUNTER — Encounter: Payer: Self-pay | Admitting: Cardiology

## 2023-08-09 ENCOUNTER — Other Ambulatory Visit: Payer: Self-pay

## 2023-08-09 ENCOUNTER — Emergency Department (HOSPITAL_COMMUNITY): Payer: Self-pay

## 2023-08-09 ENCOUNTER — Emergency Department (HOSPITAL_COMMUNITY)
Admission: EM | Admit: 2023-08-09 | Discharge: 2023-08-09 | Disposition: A | Discharge status: Home / Self Care | Payer: Self-pay | Attending: Emergency Medicine | Admitting: Emergency Medicine

## 2023-08-09 ENCOUNTER — Encounter (HOSPITAL_COMMUNITY): Payer: Self-pay | Admitting: Emergency Medicine

## 2023-08-09 DIAGNOSIS — I480 Paroxysmal atrial fibrillation: Secondary | ICD-10-CM | POA: Insufficient documentation

## 2023-08-09 DIAGNOSIS — Z7901 Long term (current) use of anticoagulants: Secondary | ICD-10-CM | POA: Insufficient documentation

## 2023-08-09 DIAGNOSIS — R7309 Other abnormal glucose: Secondary | ICD-10-CM | POA: Insufficient documentation

## 2023-08-09 DIAGNOSIS — I4891 Unspecified atrial fibrillation: Secondary | ICD-10-CM

## 2023-08-09 DIAGNOSIS — R739 Hyperglycemia, unspecified: Secondary | ICD-10-CM

## 2023-08-09 HISTORY — DX: Paroxysmal atrial fibrillation: I48.0

## 2023-08-09 LAB — BASIC METABOLIC PANEL WITH GFR
Anion gap: 10 (ref 5–15)
BUN: 26 mg/dL — ABNORMAL HIGH (ref 8–23)
CO2: 21 mmol/L — ABNORMAL LOW (ref 22–32)
Calcium: 9.5 mg/dL (ref 8.9–10.3)
Chloride: 106 mmol/L (ref 98–111)
Creatinine, Ser: 1.04 mg/dL (ref 0.61–1.24)
GFR, Estimated: >60 mL/min (ref >60)
Glucose, Bld: 128 mg/dL — ABNORMAL HIGH (ref 70–99)
Potassium: 4.6 mmol/L (ref 3.5–5.1)
Sodium: 137 mmol/L (ref 135–145)

## 2023-08-09 LAB — CBC WITH DIFFERENTIAL/PLATELET
Abs Immature Granulocytes: 0.03 10*3/uL (ref 0.00–0.07)
Basophils Absolute: 0.1 10*3/uL (ref 0.0–0.1)
Basophils Relative: 1 %
Eosinophils Absolute: 0.1 10*3/uL (ref 0.0–0.5)
Eosinophils Relative: 1 %
HCT: 45.2 % (ref 39.0–52.0)
Hemoglobin: 15.3 g/dL (ref 13.0–17.0)
Immature Granulocytes: 0 %
Lymphocytes Relative: 25 %
Lymphs Abs: 2.4 10*3/uL (ref 0.7–4.0)
MCH: 30.4 pg (ref 26.0–34.0)
MCHC: 33.8 g/dL (ref 30.0–36.0)
MCV: 89.9 fL (ref 80.0–100.0)
Monocytes Absolute: 1.2 10*3/uL — ABNORMAL HIGH (ref 0.1–1.0)
Monocytes Relative: 12 %
Neutro Abs: 6.1 10*3/uL (ref 1.7–7.7)
Neutrophils Relative %: 61 %
Platelets: 301 10*3/uL (ref 150–400)
RBC: 5.03 MIL/uL (ref 4.22–5.81)
RDW: 13.8 % (ref 11.5–15.5)
WBC: 9.8 10*3/uL (ref 4.0–10.5)
nRBC: 0 % (ref 0.0–0.2)

## 2023-08-09 LAB — MAGNESIUM: Magnesium: 2.1 mg/dL (ref 1.7–2.4)

## 2023-08-09 MED ORDER — DILTIAZEM HCL-DEXTROSE 125-5 MG/125ML-% IV SOLN (PREMIX)
5.0000 mg/h | INTRAVENOUS | Status: DC
  Administered 2023-08-09: 5 mg/h via INTRAVENOUS
  Filled 2023-08-09: qty 125

## 2023-08-09 MED ORDER — DILTIAZEM LOAD VIA INFUSION
20.0000 mg | Freq: Once | INTRAVENOUS | Status: AC
  Administered 2023-08-09: 20 mg via INTRAVENOUS
  Filled 2023-08-09: qty 20

## 2023-08-09 MED ORDER — APIXABAN 5 MG PO TABS: 5.0000 mg | ORAL_TABLET | Freq: Two times a day (BID) | ORAL | 0 refills | Status: DC

## 2023-08-09 MED ORDER — FLECAINIDE ACETATE 50 MG PO TABS
300.0000 mg | ORAL_TABLET | Freq: Once | ORAL | Status: AC
  Administered 2023-08-09: 300 mg via ORAL
  Filled 2023-08-09: qty 6

## 2023-08-09 MED ORDER — APIXABAN 5 MG PO TABS
5.0000 mg | ORAL_TABLET | Freq: Once | ORAL | Status: AC
  Administered 2023-08-09: 5 mg via ORAL
  Filled 2023-08-09: qty 1

## 2023-08-09 NOTE — Consult Note (Addendum)
 Cardiology Consultation   Patient ID: Luke Benitez MRN: [id number]; DOB: 01/13/1961  Admit date: 08/09/2023 Date of Consult: 08/09/2023  PCP:  Trudy Dorn BRAVO, MD   [facility] Providers Cardiologist:  Shelda Bruckner, MD     Patient Profile:   Luke Benitez is a 63 y.o. male with a hx of PAF, HLD, palpitations who is being seen 08/09/2023 for the evaluation of atrial fibrillation at the request of Dr. Darra.  History of Present Illness:   Luke Benitez is a 63 year old male with above medical history who has been followed by Dr. Bruckner in the past, has not been seen since 2021.  Per chart review, patient had an episode of atrial fibrillation in 02/2014.  Luke Benitez had 4 beers and 1 evening, developed palpitations and went to the ED where Luke Benitez was found to be in rapid A-fib.  Responded rapidly to IV Cardizem .  Echocardiogram in 02/2014 showed EF 65-70%, no regional wall motion abnormalities, mild aortic regurgitation.  Stress echocardiogram in 03/2014 showed no stress arrhythmias on EKG, no echocardiographic evidence for stress-induced ischemia.  Luke Benitez had a CHA2DS2-VASc of 0, was not treated with anticoagulation.  Luke Benitez was last seen by Dr. Bruckner in 06/2019.  At that time, patient was overall doing well.  Luke Benitez did have occasional palpitations that were more noticeable when laying down.  Denied increased frequency of palpitations.  Luke Benitez has not been seen by cardiology since that time  Patient was seen twice in the ED with palpitations, once in 05/2021 and again in 06/2021.  Both of these times, patient was found to be in normal sinus rhythm.  Palpitations were associated with use of CBD oil, Luke Benitez was instructed to discontinue this.  Patient presented to the ED on 4/18 around 3 AM.  Reported having palpitations that started around 11:30 PM. Initial vital signs in the ED showed HR 124 BPM, BP 150/94, oxygen 98% on room air. EKG showed atrial fibrillation with heart rate 121 bpm,  left anterior fascicular block.  Labs significant for K4.6, creatinine 1.04, magnesium 2.1, W BC 9.8, hemoglobin 15.3, platelets 301.  Chest x-ray showed no active disease.  Patient was given Eliquis  5 mg and started on IV diltiazem . Was also given a dose of oral flecainide .  Around 930 this morning, patient converted to normal sinus rhythm.  On interview, patient reports that Luke Benitez had an episode of A-fib about 10 years ago (2015).  This occurred after a night of drinking alcohol and was attributed to alcohol use/dehydration.  Luke Benitez was not started on any medications at that time.  Denies having recurrence of atrial fibrillation since then.  In 2023, Luke Benitez was seen in the ER with palpitations but was in normal rhythm.  Palpitations were related to CBD use.  Last night, Luke Benitez was in his usual state of health until Luke Benitez started to get ready for bed and started to feel palpitations/fluttering in his chest.  Reported feeling somewhat anxious and like Luke Benitez needed to move around.  Denies chest pain, shortness of breath, dizziness, lightheadedness, syncope, near syncope.  Luke Benitez called EMS and was told his heart rate was elevated so Luke Benitez decided to come in.  Once Luke Benitez was on IV diltiazem , his heart rate did improve significantly.  Even with a controlled heart rate, Luke Benitez continued to feel weird when in A-fib.  Luke Benitez converted to normal sinus rhythm about 930 this morning.  Since converting to normal sinus rhythm, Luke Benitez has been feeling very well.  Luke Benitez denies alcohol use, excessive  caffeine use.  Luke Benitez does not snore.  Luke Benitez is followed by primary care provider and had labs drawn earlier this year.  Has not been on any medications.   Past Medical History:  Diagnosis Date   Allergy    Ejection fraction     History reviewed. No pertinent surgical history.   Home Medications:  Prior to Admission medications   Medication Sig Start Date End Date Taking? Authorizing Provider  apixaban  (ELIQUIS ) 5 MG TABS tablet Take 1 tablet (5 mg total) by mouth 2  (two) times daily. 08/09/23 09/08/23 Yes Raford Lenis, MD    Inpatient Medications: Scheduled Meds:  Continuous Infusions:  diltiazem  (CARDIZEM ) infusion 5 mg/hr (08/09/23 0424)   PRN Meds:   Allergies:   No Known Allergies  Social History:   Social History   Socioeconomic History   Marital status: Divorced    Spouse name: Not on file   Number of children: Not on file   Years of education: Not on file   Highest education level: Not on file  Occupational History   Not on file  Tobacco Use   Smoking status: Never   Smokeless tobacco: Never  Substance and Sexual Activity   Alcohol use: Yes    Alcohol/week: 4.0 standard drinks of alcohol    Types: 4 Cans of beer per week   Drug use: No   Sexual activity: Not on file  Other Topics Concern   Not on file  Social History Narrative   Not on file   Social Drivers of Health   Financial Resource Strain: Not on file  Food Insecurity: Not on file  Transportation Needs: Not on file  Physical Activity: Not on file  Stress: Not on file  Social Connections: Not on file  Intimate Partner Violence: Not on file    Family History:    Family History  Problem Relation Age of Onset   Cancer Mother    Cancer Father    Stroke Maternal Grandmother    Emphysema Maternal Grandfather    Heart attack Paternal Grandfather      ROS:  Please see the history of present illness.   All other ROS reviewed and negative.     Physical Exam/Data:   Vitals:   08/09/23 0500 08/09/23 0600 08/09/23 0659 08/09/23 0915  BP: 114/74 111/74  123/77  Pulse: 70 85  89  Resp: 20 (!) 21  17  Temp:   97.8 F (36.6 C) 97.6 F (36.4 C)  TempSrc:   Oral Oral  SpO2: 98% 95%  97%  Weight:      Height:       No intake or output data in the 24 hours ending 08/09/23 1055    08/09/2023    2:54 AM 06/26/2021   12:30 AM 06/16/2021    9:53 AM  Last 3 Weights  Weight (lbs) 212 lb 220 lb 222 lb  Weight (kg) 96.163 kg 99.791 kg 100.699 kg     Body mass  index is 27.97 kg/m.  General:  Well nourished, well developed, in no acute distress. Sitting comfortably in the bed  HEENT: normal Neck: no JVD Vascular: Radial pulses 2+ bilaterally Cardiac:  normal S1, S2; RRR; no murmur   Lungs:  clear to auscultation bilaterally, no wheezing, rhonchi or rales. Normal WOB on room air  Abd: soft, nontender, no hepatomegaly  Ext: no edema in BLE  Musculoskeletal:  No deformities, BUE and BLE strength normal and equal Skin: warm and dry  Neuro:  no focal abnormalities noted Psych:  Normal affect   EKG:  The EKG was personally reviewed and demonstrates:  Initial EKG in the ED showed atrial fibrillation with HR 121 BPM, LAFB. Repeat EKG this AM around 1130 showed NSR, LAFB  Telemetry:  Telemetry was personally reviewed and demonstrates:  Converted to NSR with HR in the 60s-70s  Relevant CV Studies:    Laboratory Data:  High Sensitivity Troponin:  No results for input(s): TROPONINIHS in the last 720 hours.   Chemistry Recent Labs  Lab 08/09/23 0405  NA 137  K 4.6  CL 106  CO2 21*  GLUCOSE 128*  BUN 26*  CREATININE 1.04  CALCIUM  9.5  MG 2.1  GFRNONAA >60  ANIONGAP 10    No results for input(s): PROT, ALBUMIN, AST, ALT, ALKPHOS, BILITOT in the last 168 hours. Lipids No results for input(s): CHOL, TRIG, HDL, LABVLDL, LDLCALC, CHOLHDL in the last 168 hours.  Hematology Recent Labs  Lab 08/09/23 0325  WBC 9.8  RBC 5.03  HGB 15.3  HCT 45.2  MCV 89.9  MCH 30.4  MCHC 33.8  RDW 13.8  PLT 301   Thyroid  No results for input(s): TSH, FREET4 in the last 168 hours.  BNPNo results for input(s): BNP, PROBNP in the last 168 hours.  DDimer No results for input(s): DDIMER in the last 168 hours.   Radiology/Studies:  DG Chest Port 1 View Result Date: 08/09/2023 CLINICAL DATA:  Atrial fibrillation.5107. EXAM: PORTABLE CHEST 1 VIEW COMPARISON:  PA and lateral chest 06/16/2021 FINDINGS: The heart size and  mediastinal contours are within normal limits. Both lungs are clear. The visualized skeletal structures are unremarkable. IMPRESSION: No active disease.  Stable chest. Electronically Signed   By: Francis Quam M.D.   On: 08/09/2023 03:20     Assessment and Plan:   Paroxysmal atrial fibrillation - Patient previously had an episode of atrial fibrillation in 2015 in the setting of alcohol use and dehydration.  At that time, Luke Benitez converted to normal sinus rhythm while on IV diltiazem .  Was not started on [facility] due to CHA2DS2-VASc 0 - Luke Benitez has not had recurrence of atrial fibrillation until yesterday evening.  Luke Benitez was getting ready for bed when Luke Benitez started to feel a fluttering in his chest and also felt somewhat anxious/antsy.  Came to the ER and was told Luke Benitez was back in A-fib - Patient denies recent alcohol use, excessive caffeine use.  K4.6, mag 2.1.  - Luke Benitez was started on IV diltiazem . Was given a dose of flecainide  around 4 AM,  converted to normal sinus rhythm this morning at 0930  - Patient very symptomatic and Luke Benitez is able to tell when Luke Benitez is in atrial fibrillation. Consider PRN diltiazem  at dc  - CHA2DS2-VASc 0.  With his low A-fib burden (2 episodes of A-fib in the past 10 years), I do not think Luke Benitez needs to be on oral anticoagulation at this time.  Dr. Lavona to see and confirm plan  - Arranged outpatient follow-up.  Will need echocardiogram at that time. Could also consider cardiac monitor to assess afib burden. 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 The patient's score is based upon: CHF History: 0 HTN History:  0 Diabetes History: 0 Stroke History: 0 Vascular Disease History: 0 Age Score: 0 Gender Score: 0    For questions or updates, please  contact [facility] Please consult [email or web address] for contact info under    Signed, Rollo FABIENE Louder, PA-C  08/09/2023 10:55 AM  History and all data above reviewed.  Patient examined.  I agree with the findings as above. The patient has a history of atrial fib related to dehydration and ETOH several years ago as above.   Luke Benitez has since not has sustained documented fib.  Luke Benitez has had some palpitations.  Luke Benitez otherwise is well and is active.  Luke Benitez does not drink ETOH.  Luke Benitez has modest  caffeine.  The patient denies any new symptoms such as chest discomfort, neck or arm discomfort. There has been no new shortness of breath, PND or orthopnea. There have been no reporte presyncope or syncope.   Luke Benitez felt rapid heart beat, uneasy and vague discomfort in his chest last night and it felt like atrial fib.  Presented and had atrial fib that did not respond to flecainide .  Luke Benitez was put on IV dilt.  Luke Benitez did convert back to sinus on his own.    The patient exam reveals COR:RRR, no murmurs.   ,  Lungs: Clear  ,  Abd: Positive bowel sounds, no rebound no guarding, Ext No edema   .  All available labs, radiology testing, previous records reviewed. Agree with documented assessment and plan.   Atrial fib:  Low risk for embolism.  PAF.  I don't think Luke Benitez needs DOAC.   I have suggested that Luke Benitez get an Apple watch to evaluate palpitations in the future.  If Luke Benitez has frequent palpitations and we cannot document on his wearable Luke Benitez would need a Zio or other patch.  We will follow up in the clinic.  At this point I am not suggesting ablation.  Luke Benitez does need an out patient echo and has follow up scheduled with us .  Mildly abnormal EKG:  Likely not clinically significant.  Luke Benitez can get echo as above.   Lynwood Aizlyn Schifano  12:26 PM  08/09/2023

## 2023-08-09 NOTE — ED Triage Notes (Signed)
 Patient BIB EMS for evaluation of palpations.  Reports symptoms started around 11:30 PM.  No prior cardiac hx.  No reports of chest pain.  EMS reported Afib RVR with first EKG.  Given Cardizem  20 mg IV.  HR was around 202 prior to medication.  Pt does report one episode 13 years ago where he did receive Cardizem .

## 2023-08-09 NOTE — Discharge Instructions (Addendum)
 Please continue your home medications. Follow with the Cardiology team at discharge.

## 2023-08-09 NOTE — ED Provider Notes (Signed)
 Blood pressure 111/74, pulse 85, temperature 97.8 F (36.6 C), temperature source Oral, resp. rate (!) 21, height 6' 1 (1.854 m), weight 96.2 kg, SpO2 95%.  Assuming care from Dr. Raford.  In short, Luke Benitez is a 63 y.o. male with a chief complaint of Palpitations .  Refer to the original H&P for additional details.  The current plan of care is to follow up after flecanide.  08:35 AM  Patient did not convert to NSR. Remain on diltiazem  infusion. Discussed case with Dr. Lavona. Cardiology to consult in the ED.   Patient converted spontaneously to NSR while in the ED. Cardiology consulted and recommends discharge with close follow up.  CHA2DS2-VASc 0. No anticoagulation recommended.    Darra Fonda MATSU, MD 08/11/23 [phone]

## 2023-08-09 NOTE — ED Provider Notes (Signed)
 [place] EMERGENCY DEPARTMENT AT [facility] Provider Note   CSN: [id number] Arrival date & time: 08/09/23  8657     History  Chief Complaint  Patient presents with   Palpitations    Luke Benitez is a 63 y.o. male.  The history is provided by the patient.  Palpitations He has history of paroxysmal atrial fibrillation not on anticoagulant since of rapid heart rate which started about 11:30 PM.  He denies chest pain, heaviness, tightness, pressure.  He denies dyspnea, nausea, vomiting, diaphoresis.  Prior episode of atrial fibrillation occurred after heavy drinking, he denies alcohol use tonight.   Home Medications Prior to Admission medications   Medication Sig Start Date End Date Taking? Authorizing Provider  apixaban  (ELIQUIS ) 5 MG TABS tablet Take 1 tablet (5 mg total) by mouth 2 (two) times daily. 08/09/23 09/08/23 Yes Alissa April, MD  MAGNESIUM PO Take 10 mLs by mouth See admin instructions. Mix in to water and drink in the evening Magnesium supplement    [provider]  OVER THE COUNTER MEDICATION Take 10 mLs by mouth every evening. Mix with water and drink in the evening Sleep complex supplement (melatonin, l-tryptophan, gaba,valerian root, lemon balm,theonine ashwaghanda)    [provider]  rosuvastatin  (CRESTOR ) 20 MG tablet Take 0.5 tablets (10 mg total) by mouth daily. Patient not taking: Reported on 06/26/2021 11/05/19 02/03/20  Sheryle Donning, MD      Allergies    Patient has no known allergies.    Review of Systems   Review of Systems  Cardiovascular:  Positive for palpitations.  All other systems reviewed and are negative.   Physical Exam Updated Vital Signs BP 111/74   Pulse 85   Temp 97.8 F (36.6 C) (Oral)   Resp (!) 21   Ht 6\' 1"  (1.854 m)   Wt 96.2 kg   SpO2 95%   BMI 27.97 kg/m  Physical Exam Vitals and nursing note reviewed.   63 year old male, resting comfortably and in no acute distress. Vital signs  are significant for rapid heart rate and elevated blood pressure. Oxygen saturation is 98%, which is normal. Head is normocephalic and atraumatic. PERRLA, EOMI.  Lungs are clear without rales, wheezes, or rhonchi. Chest is nontender. Heart is tachycardic and irregular murmur. Abdomen is soft, flat, nontender. Extremities have no cyanosis or edema. Skin is warm and dry without rash. Neurologic: Mental status is normal, moves all extremities equally.  ED Results / Procedures / Treatments   Labs (all labs ordered are listed, but only abnormal results are displayed) Labs Reviewed  CBC WITH DIFFERENTIAL/PLATELET - Abnormal; Notable for the following components:      Result Value   Monocytes Absolute 1.2 (*)    All other components within normal limits  BASIC METABOLIC PANEL WITH GFR - Abnormal; Notable for the following components:   CO2 21 (*)    Glucose, Bld 128 (*)    BUN 26 (*)    All other components within normal limits  MAGNESIUM    EKG EKG Interpretation Date/Time:  Friday August 09 2023 02:50:15 EDT Ventricular Rate:  121 PR Interval:    QRS Duration:  90 QT Interval:  308 QTC Calculation: 437 R Axis:   -62  Text Interpretation: Atrial fibrillation with rapid ventricular response Left anterior fascicular block Moderate voltage criteria for LVH, may be normal variant ( R in aVL , Cornell product ) Abnormal ECG When compared with ECG of 26-Jun-2021 00:29, Atrial fibrillation with rapid  ventricular response has replaced Sinus rhythm Confirmed by Alissa April ([id number]) on 08/09/2023 3:08:37 AM  Radiology DG Chest Port 1 View Result Date: 08/09/2023 CLINICAL DATA:  Atrial fibrillation.5107. EXAM: PORTABLE CHEST 1 VIEW COMPARISON:  PA and lateral chest 06/16/2021 FINDINGS: The heart size and mediastinal contours are within normal limits. Both lungs are clear. The visualized skeletal structures are unremarkable. IMPRESSION: No active disease.  Stable chest. Electronically Signed    By: Denman Fischer M.D.   On: 08/09/2023 03:20    Procedures Procedures  Cardiac monitor showed atrial fibrillation with rapid ventricular response, per my interpretation.  Medications Ordered in ED Medications  diltiazem  (CARDIZEM ) 1 mg/mL load via infusion 20 mg (20 mg Intravenous Bolus from Bag 08/09/23 0425)    And  diltiazem  (CARDIZEM ) 125 mg in dextrose  5% 125 mL (1 mg/mL) infusion (5 mg/hr Intravenous New Bag/Given 08/09/23 0424)  flecainide  (TAMBOCOR ) tablet 300 mg (300 mg Oral Given 08/09/23 0353)  apixaban  (ELIQUIS ) tablet 5 mg (5 mg Oral Given 08/09/23 0354)    ED Course/ Medical Decision Making/ A&P         CHA2DS2-VASc Score: 0                        Medical Decision Making Amount and/or Complexity of Data Reviewed Labs: ordered. Radiology: ordered.  Risk Prescription drug management.   Paroxysmal atrial fibrillation with rapid ventricular response.  I reviewed his past records, and note ED visit for atrial fibrillation on 03/12/2014, not anticoagulated because of low risk.  I have reviewed his electrocardiogram, my interpretation is atrial fibrillation with rapid ventricular response, left anterior fascicular block, LVH.  I recommended cardioversion to the patient, but he does not wish to go that route.  Therefore, I have ordered diltiazem  loading dose and infusion, oral flecainide .  I had prior ED visit, he was treated with diltiazem .  CHA2DS2-VASc score is 0.    7:53 AM Patient is resting comfortably.  Monitor continues to show atrial fibrillation, but now with a controlled ventricular response.  He received flecainide  at 0353.  I am ordering a consultation with cardiology.  Patient does state that if he does not convert to sinus rhythm 4 hours after his dose of flecainide , and he will agree to cardioversion.    7:53 AM Patient is still in atrial fibrillation with controlled ventricular response.  Case is signed out to Dr. Dolan Freiberg, oncoming physician.  CRITICAL  CARE Performed by: Alissa April Total critical care time: 45 minutes Critical care time was exclusive of separately billable procedures and treating other patients. Critical care was necessary to treat or prevent imminent or life-threatening deterioration. Critical care was time spent personally by me on the following activities: development of treatment plan with patient and/or surrogate as well as nursing, discussions with consultants, evaluation of patient's response to treatment, examination of patient, obtaining history from patient or surrogate, ordering and performing treatments and interventions, ordering and review of laboratory studies, ordering and review of radiographic studies, pulse oximetry and re-evaluation of patient's condition.  Final Clinical Impression(s) / ED Diagnoses Final diagnoses:  Paroxysmal atrial fibrillation (HCC)  Elevated random blood glucose level    Rx / DC Orders ED Discharge Orders          Ordered    Amb referral to AFIB Clinic        08/09/23 0322    apixaban  (ELIQUIS ) 5 MG TABS tablet  2 times daily  08/09/23 4098              Alissa April, MD 08/09/23 [phone]

## 2023-08-09 NOTE — ED Notes (Signed)
 Pt understood d/c instructions and when to return to the ED. All questions were answered. Pt is leaving here by himself

## 2023-08-26 ENCOUNTER — Ambulatory Visit: Payer: Self-pay | Attending: Emergency Medicine

## 2023-08-26 ENCOUNTER — Encounter: Payer: Self-pay | Admitting: Emergency Medicine

## 2023-08-26 ENCOUNTER — Ambulatory Visit: Payer: Self-pay | Attending: Emergency Medicine | Admitting: Emergency Medicine

## 2023-08-26 VITALS — BP 126/86 | HR 75 | Ht 73.0 in | Wt 214.7 lb

## 2023-08-26 DIAGNOSIS — I480 Paroxysmal atrial fibrillation: Secondary | ICD-10-CM

## 2023-08-26 DIAGNOSIS — R9431 Abnormal electrocardiogram [ECG] [EKG]: Secondary | ICD-10-CM

## 2023-08-26 DIAGNOSIS — I444 Left anterior fascicular block: Secondary | ICD-10-CM

## 2023-08-26 NOTE — Patient Instructions (Signed)
 Medication Instructions:  NO CHANGES   Lab Work: NONE   Testing/Procedures: Delane Fear- Long Term Monitor Instructions  Your physician has requested you wear a ZIO patch monitor for 14 days.  This is a single patch monitor. [employer] supplies one patch monitor per enrollment. Additional stickers are not available. Please do not apply patch if you will be having a Nuclear Stress Test,  Echocardiogram, Cardiac CT, MRI, or Chest Xray during the period you would be wearing the  monitor. The patch cannot be worn during these tests. You cannot remove and re-apply the  ZIO XT patch monitor.  Your ZIO patch monitor will be mailed 3 day USPS to your address on file. It may take 3-5 days  to receive your monitor after you have been enrolled.  Once you have received your monitor, please review the enclosed instructions. Your monitor  has already been registered assigning a specific monitor serial # to you.  Billing and Patient Assistance Program Information  We have supplied [employer] with any of your insurance information on file for billing purposes. [employer] offers a sliding scale Patient Assistance Program for patients that do not have  insurance, or whose insurance does not completely cover the cost of the ZIO monitor.  You must apply for the Patient Assistance Program to qualify for this discounted rate.  To apply, please call [employer] at [phone], select option 4, select option 2, ask to apply for  Patient Assistance Program. Sanna Sage Hammill will ask your household income, and how many people  are in your household. They will quote your out-of-pocket cost based on that information.  [employer] will also be able to set up a 88-month, interest-free payment plan if needed.  Applying the monitor   Shave hair from upper left chest.  Hold abrader disc by orange tab. Rub abrader in 40 strokes over the upper left chest as  indicated in your monitor instructions.  Clean area with 4 enclosed alcohol pads. Let  dry.  Apply patch as indicated in monitor instructions. Patch will be placed under collarbone on left  side of chest with arrow pointing upward.  Rub patch adhesive wings for 2 minutes. Remove white label marked "1". Remove the white  label marked "2". Rub patch adhesive wings for 2 additional minutes.  While looking in a mirror, press and release button in center of patch. A small green light will  flash 3-4 times. This will be your only indicator that the monitor has been turned on.  Do not shower for the first 24 hours. You may shower after the first 24 hours.  Press the button if you feel a symptom. You will hear a small click. Record Date, Time and  Symptom in the Patient Logbook.  When you are ready to remove the patch, follow instructions on the last 2 pages of Patient  Logbook. Stick patch monitor onto the last page of Patient Logbook.  Place Patient Logbook in the blue and white box. Use locking tab on box and tape box closed  securely. The blue and white box has prepaid postage on it. Please place it in the mailbox as  soon as possible. Your physician should have your test results approximately 7 days after the  monitor has been mailed back to [facility].  Call [facility] Customer Care at [phone] if you have questions regarding  your ZIO XT patch monitor. Call them immediately if you see an orange light blinking on your  monitor.  If your monitor falls off in  less than 4 days, contact our Monitor department at [phone].  If your monitor becomes loose or falls off after 4 days call [employer] at [phone] for  suggestions on securing your monitor   Your physician has requested that you have an ECHOCARDIOGRAM. Echocardiography is a painless test that uses sound waves to create images of your heart. It provides your doctor with information about the size and shape of your heart and how well your heart's chambers and valves are working. This procedure takes  approximately one hour. There are no restrictions for this procedure. Please do NOT wear cologne, perfume, aftershave, or lotions (deodorant is allowed). Please arrive 15 minutes prior to your appointment time.  Please note: We ask at that you not bring children with you during ultrasound (echo/ vascular) testing. Due to room size and safety concerns, children are not allowed in the ultrasound rooms during exams. Our front office staff cannot provide observation of children in our lobby area while testing is being conducted. An adult accompanying a patient to their appointment will only be allowed in the ultrasound room at the discretion of the ultrasound technician under special circumstances. We apologize for any inconvenience.   Follow-Up: At [facility], you and your health needs are our priority.  As part of our continuing mission to provide you with exceptional heart care, our providers are all part of one team.  This team includes your primary Cardiologist (physician) and Advanced Practice Providers or APPs (Physician Assistants and Nurse Practitioners) who all work together to provide you with the care you need, when you need it.  Your next appointment:   8 WEEKS  Provider:   Sheryle Donning, MD OR Palmer Bobo, DNP  We recommend signing up for the patient portal called "MyChart".  Sign up information is provided on this After Visit Summary.  MyChart is used to connect with patients for Virtual Visits (Telemedicine).  Patients are able to view lab/test results, encounter notes, upcoming appointments, etc.  Non-urgent messages can be sent to your provider as well.   To learn more about what you can do with MyChart, go to [email or web address].

## 2023-08-26 NOTE — Progress Notes (Unsigned)
 Enrolled for [employer] to mail a ZIO XT long term holter monitor to the patients address on file.   Dr. B.Christopher to read.

## 2023-08-26 NOTE — Progress Notes (Signed)
 Cardiology Office Note:    Date:  08/26/2023  ID:  Luke Benitez, DOB 1961-02-12, MRN [id number] PCP: Luke Demark, MD  [facility] Providers Cardiologist:  Sheryle Donning, MD       Patient Profile:      Chief Complaint: ED follow-up for atrial fibrillation with RVR History of Present Illness:  Luke Benitez is a 63 y.o. male with visit-pertinent history of proximal atrial fibrillation, hyperlipidemia, palpitations  Patient had an episode of atrial fibrillation in 02/2014. He had 4 beers and 1 evening, developed palpitations and went to the ED where he was found to be in rapid A-fib.  Responded rapidly to IV Cardizem .  Echocardiogram in 02/2014 showed EF 65-70%, no regional wall motion abnormalities, mild aortic regurgitation.  Stress echocardiogram in 03/2014 showed no stress arrhythmias on EKG, no echocardiographic evidence for stress-induced ischemia.  He had a CHA2DS2-VASc of 0, was not treated with anticoagulation.   He established with cardiology service in 2020 for management of his atrial fibrillation.  He was last seen in clinic on 07/08/2019 by Dr. Veryl Benitez.  He was doing well at that time the medication regimen was continued.  Patient was seen twice in the ED with palpitations, once in 05/2021 and again in 06/2021. Both of these times, patient was found to be in normal sinus rhythm. Palpitations were associated with use of CBD oil, he was instructed to discontinue this.   He was recently seen in the emergency department on 08/09/2023 with atrial fibrillation and rapid ventricular response.  Initial vital signs in the ED showed heart rate 124 bpm.  He was given flecainide  300 mg as well as Cardizem  20 mg IV bolus and IV diltiazem  infusion.  Patient did convert spontaneously to NSR while in the ED.  He was seen by cardiology and not started on oral anticoagulation due to CHA2DS2-VASc score of 0.  He was discharged in stable condition with close follow-up with  cardiology.   Discussed the use of AI scribe software for clinical note transcription with the patient, who gave verbal consent to proceed.  History of Present Illness Luke Benitez "Luke Benitez" is a 63 year old male with atrial fibrillation who presents for follow-up after an ER visit for atrial fibrillation with rapid ventricular rate.  Since his discharge back home he denies any symptoms suggestive of recurrent atrial fibrillation.  He has noted just 1 brief episode of "chest fluttering" that occurred this past week.  He denies any other palpitations or irregular heart rhythms.  He engages in regular physical activity, including weightlifting and basketball, but feels exhausted and not recovering well, possibly due to overexertion. He maintains hydration with at least 64 ounces of water daily and denies dehydration. He is not currently on any medication. Blood work in the ER showed normal kidney function, normal magnesium, and slightly elevated glucose levels. A thyroid  panel from two years ago was normal. Anticoagulation has not been initiated due to a low CHADSVASc score.  He denies chest pain during activities or at rest. Initially, he experienced shortness of breath with light activities, but his cardiovascular endurance has improved. He can play basketball for about 20 minutes before needing to rest. He has occasionally experienced palpitations in the past, especially after large meals or intense physical activity, which are sometimes alleviated by coughing.  He is without any syncope, presyncope, lightheadedness, dizziness, orthopnea, PND, DOE.   Review of systems:  Please see the history of present illness. All other systems are reviewed and otherwise  negative.     Home Medications:    No outpatient medications have been marked as taking for the 08/26/23 encounter (Office Visit) with Luke Boatman, NP.   Studies Reviewed:   EKG Interpretation Date/Time:  Monday Aug 26 2023  14:49:02 EDT Ventricular Rate:  75 PR Interval:  158 QRS Duration:  106 QT Interval:  376 QTC Calculation: 419 R Axis:   -59  Text Interpretation: Normal sinus rhythm Incomplete right bundle branch block Left anterior fascicular block Minimal voltage criteria for LVH, may be normal variant ( Cornell product ) Confirmed by Palmer Bobo [phone]) on 08/26/2023 5:04:36 PM    Risk Assessment/Calculations:    CHA2DS2-VASc Score = 0   This indicates a 0.2% annual risk of stroke. The patient's score is based upon: CHF History: 0 HTN History: 0 Diabetes History: 0 Stroke History: 0 Vascular Disease History: 0 Age Score: 0 Gender Score: 0             Physical Exam:   VS:  BP 126/86   Pulse 75   Ht 6\' 1"  (1.854 m)   Wt 214 lb 11.2 oz (97.4 kg)   SpO2 97%   BMI 28.33 kg/m    Wt Readings from Last 3 Encounters:  08/26/23 214 lb 11.2 oz (97.4 kg)  08/09/23 212 lb (96.2 kg)  06/26/21 220 lb (99.8 kg)    GEN: Well nourished, well developed in no acute distress NECK: No JVD; No carotid bruits CARDIAC: RRR, no murmurs, rubs, gallops RESPIRATORY:  Clear to auscultation without rales, wheezing or rhonchi  ABDOMEN: Soft, non-tender, non-distended EXTREMITIES:  No edema; No acute deformity     Assessment and Plan:  Paroxysmal atrial fibrillation He had previous episode of PAF in 2015 in the setting of alcohol use and dehydration at that time converted to NSR while on IV diltiazem .  He has not had a reoccurrence until 08/09/2023 where he was given oral flecainide  and continuous IV diltiazem  and converted to NSR - No EtOH or drug use and minimal caffeine use and remains very active - He has been very symptomatic during his times in atrial fibrillation which has been associated with RVR in the past, however he has noted very brief chest fluttering and palpitations after large meals in the past - Today his EKG shows NSR with LAFB without acute ischemic changes - Plan for echocardiogram to  evaluate LV function, valvular abnormalities, and biatrial dilation - Plan for 14-day ZIO monitor to capture A-fib burden.  If in fact he is having frequent PAF he would be a candidate for ablation - CBC, BMET, MAG WNL on 08/09/2023 - Much education of potential triggers - Discussed with patient about [employer] and Apple Watch to monitor for arrhythmias at home - CHA2DS2-VASc Score = 0.  He does not meet criteria for DOAC at this time  Left anterior fascicular block Noted on EKG 4/18 and again today - Echocardiogram ordered today to evaluate LV function     Dispo:  Return in about 8 weeks (around 10/21/2023).  Signed, Luke Boatman, NP

## 2023-09-30 ENCOUNTER — Ambulatory Visit (HOSPITAL_COMMUNITY): Payer: Self-pay

## 2023-10-21 ENCOUNTER — Ambulatory Visit: Payer: Self-pay | Attending: Emergency Medicine | Admitting: Emergency Medicine

## 2023-10-21 NOTE — Progress Notes (Deleted)
 Cardiology Office Note:    Date:  10/21/2023  ID:  Delano Frate, DOB 1960/09/17, MRN [id number] PCP: Trudy Dorn BRAVO, MD  [facility] Providers Cardiologist:  Shelda Bruckner, MD { Click to update primary MD,subspecialty MD or APP then REFRESH:1}    {Click to Open Review  :1}   Patient Profile:       Chief Complaint: *** History of Present Illness:  Luke Benitez is a 63 y.o. male with visit-pertinent history of paroxysmal atrial fibrillation, hyperlipidemia, palpitations   Patient had an episode of atrial fibrillation in 02/2014. He had 4 beers and 1 evening, developed palpitations and went to the ED where he was found to be in rapid A-fib.  Responded rapidly to IV Cardizem .  Echocardiogram in 02/2014 showed EF 65-70%, no regional wall motion abnormalities, mild aortic regurgitation.  Stress echocardiogram in 03/2014 showed no stress arrhythmias on EKG, no echocardiographic evidence for stress-induced ischemia.  He had a CHA2DS2-VASc of 0, was not treated with anticoagulation.    He established with cardiology service in 2020 for management of his atrial fibrillation.  He was last seen in clinic on 07/08/2019 by Dr. Bruckner.  He was doing well at that time the medication regimen was continued.   Patient was seen twice in the ED with palpitations, once in 05/2021 and again in 06/2021. Both of these times, patient was found to be in normal sinus rhythm. Palpitations were associated with use of CBD oil, he was instructed to discontinue this.    He was recently seen in the emergency department on 08/09/2023 with atrial fibrillation and rapid ventricular response.  Initial vital signs in the ED showed heart rate 124 bpm.  He was given flecainide  300 mg as well as Cardizem  20 mg IV bolus and IV diltiazem  infusion.  Patient did convert spontaneously to NSR while in the ED.  He was seen by cardiology and not started on oral anticoagulation due to CHA2DS2-VASc score of 0.  He  was discharged in stable condition with close follow-up with cardiology.  He was last seen in office on 08/26/2023.  He had noted very brief chest fluttering palpitations particularly after large meals.  His EKG showed that he was maintaining NSR.  Discussed the use of AI scribe software for clinical note transcription with the patient, who gave verbal consent to proceed.  History of Present Illness     Review of systems:  Please see the history of present illness. All other systems are reviewed and otherwise negative. ***      Studies Reviewed:        ***  Risk Assessment/Calculations:   {Does this patient have ATRIAL FIBRILLATION?:[id number]} No BP recorded.  {Refresh Note OR Click here to enter BP  :1}***        Physical Exam:   VS:  There were no vitals taken for this visit.   Wt Readings from Last 3 Encounters:  08/26/23 214 lb 11.2 oz (97.4 kg)  08/09/23 212 lb (96.2 kg)  06/26/21 220 lb (99.8 kg)    GEN: Well nourished, well developed in no acute distress NECK: No JVD; No carotid bruits CARDIAC: ***RRR, no murmurs, rubs, gallops RESPIRATORY:  Clear to auscultation without rales, wheezing or rhonchi  ABDOMEN: Soft, non-tender, non-distended EXTREMITIES:  No edema; No acute deformity ***      Assessment and Plan:    Assessment and Plan Assessment & Plan      {Are you ordering a CV Procedure (e.g. stress test, cath, DCCV,  TEE, etc)?   Press F2        :[id number]}  Dispo:  No follow-ups on file.  Signed, Lum LITTIE Louis, NP

## 2023-10-22 ENCOUNTER — Encounter: Payer: Self-pay | Admitting: Emergency Medicine

## 2023-11-12 ENCOUNTER — Other Ambulatory Visit (HOSPITAL_COMMUNITY): Payer: Self-pay

## 2023-11-18 ENCOUNTER — Ambulatory Visit (HOSPITAL_COMMUNITY)
Admission: RE | Admit: 2023-11-18 | Discharge: 2023-11-18 | Disposition: A | Discharge status: Home / Self Care | Payer: Self-pay | Source: Ambulatory Visit | Attending: Cardiology | Admitting: Cardiology

## 2023-11-18 ENCOUNTER — Ambulatory Visit: Payer: Self-pay | Admitting: Emergency Medicine

## 2023-11-18 DIAGNOSIS — I480 Paroxysmal atrial fibrillation: Secondary | ICD-10-CM | POA: Insufficient documentation

## 2023-11-18 LAB — ECHOCARDIOGRAM COMPLETE
AR max vel: 3.48 cm2
AV Peak grad: 4 mmHg
Ao pk vel: 1 m/s
Area-P 1/2: 3.46 cm2
P 1/2 time: 807 ms
S' Lateral: 3.1 cm

## 2023-11-25 DIAGNOSIS — I480 Paroxysmal atrial fibrillation: Secondary | ICD-10-CM

## 2023-11-29 NOTE — Telephone Encounter (Signed)
 Pt returning call to a nurse for results

## 2024-01-03 ENCOUNTER — Encounter (HOSPITAL_BASED_OUTPATIENT_CLINIC_OR_DEPARTMENT_OTHER): Payer: Self-pay | Admitting: *Deleted

## 2024-01-06 ENCOUNTER — Encounter: Payer: Self-pay | Admitting: *Deleted

## 2024-01-06 NOTE — Progress Notes (Signed)
 Cardiology Office Note:    Date:  01/07/2024  ID:  Luke Benitez, DOB 12/04/1960, MRN [id number] PCP: Trudy Dorn BRAVO, MD  [facility] Providers Cardiologist:  Shelda Bruckner, MD Cardiology APP:  Rana Lum CROME, NP       Patient Profile:       Chief Complaint: 64-month follow-up History of Present Illness:  Luke Benitez is a 63 y.o. male with visit-pertinent history of paroxysmal atrial fibrillation, hyperlipidemia, palpitations   Patient had an episode of atrial fibrillation in 02/2014. He had 4 beers and 1 evening, developed palpitations and went to the ED where he was found to be in rapid A-fib.  Responded rapidly to IV Cardizem .  Echocardiogram in 02/2014 showed EF 65-70%, no regional wall motion abnormalities, mild aortic regurgitation.  Stress echocardiogram in 03/2014 showed no stress arrhythmias on EKG, no echocardiographic evidence for stress-induced ischemia.  He had a CHA2DS2-VASc of 0, was not treated with anticoagulation.    He established with cardiology service in 2020 for management of his atrial fibrillation.  He was last seen in clinic on 07/08/2019 by Dr. Bruckner.  He was doing well at that time his medication regimen was continued.   Patient was seen twice in the ED with palpitations, once in 05/2021 and again in 06/2021. Both of these times, patient was found to be in normal sinus rhythm. Palpitations were associated with use of CBD oil, he was instructed to discontinue this.    He was recently seen in the emergency department on 08/09/2023 with atrial fibrillation and rapid ventricular response.  Initial vital signs in the ED showed heart rate 124 bpm.  He was given flecainide  300 mg as well as Cardizem  20 mg IV bolus and IV diltiazem  infusion.  Patient did convert spontaneously to NSR while in the ED.  He was seen by cardiology and not started on oral anticoagulation due to CHA2DS2-VASc score of 0.  He was discharged in stable condition with  close follow-up with cardiology.  He was last seen in clinic on 08/26/2023.  He was doing well and maintaining sinus rhythm.  14-day ZIO was ordered showing average heart rate of 70 bpm, predominant rhythm was sinus rhythm, 1 run of supraventricular tachycardia occurred lasting 5 beats with a max rate of 119 bpm.  No VT, atrial fibrillation, high degree block, or pauses were noted.  20 triggered events were associated with sinus.  Echocardiogram 10/2023 showed LVEF 60 to 65%, no RWMA, grade 1 DD, RV function and size normal, trivial MR, trivial AI.   Discussed the use of AI scribe software for clinical note transcription with the patient, who gave verbal consent to proceed.  History of Present Illness Luke Benitez is a 63 year old male with atrial fibrillation who presents for follow-up after a hospital visit for atrial fibrillation.  Today he is without any acute cardiovascular concerns or complaints.  He denies any further episodes concerning for recurrent atrial fibrillation.  He is without any chest pains, dyspnea, orthopnea, PND.  He denies any palpitations, lightheadedness, dizziness, syncope or presyncope.  He maintains a very active lifestyle and routinely goes to the gym and performs weightlifting without any exertional symptoms.  He identifies potential triggers for his past atrial fibrillation episodes, including alcohol, caffeine, stress, and possible electrolyte imbalances. He maintains good hydration and monitors his diet to prevent recurrence.   Review of systems:  Please see the history of present illness. All other systems are reviewed and otherwise negative.  Studies Reviewed:        ZIO 08/26/2023 Patch Wear Time:  13 days and 12 hours (2025-05-23T21:47:04-398 to 2025-06-06T10:38:08-398)   Patient had a min HR of 45 bpm, max HR of 160 bpm, and avg HR of 78 bpm. Predominant underlying rhythm was Sinus Rhythm. 1 run of Supraventricular Tachycardia occurred  lasting 5 beats with a max rate of 119 bpm (avg 107 bpm). No VT, atrial fibrillation, high degree block, or pauses noted. Isolated atrial and ventricular ectopy was rare (<1%). There were 20 triggered events; these were predominantly sinus, some with ectopy. No high risk arrhythmias detected.   Echocardiogram 11/18/2023  1. Left ventricular ejection fraction, by estimation, is 60 to 65%. The  left ventricle has normal function. The left ventricle has no regional  wall motion abnormalities. Left ventricular diastolic parameters are  consistent with Grade I diastolic  dysfunction (impaired relaxation).   2. Right ventricular systolic function is normal. The right ventricular  size is normal.   3. The mitral valve is normal in structure. Trivial mitral valve  regurgitation. No evidence of mitral stenosis.   4. The aortic valve is tricuspid. Aortic valve regurgitation is trivial.  No aortic stenosis is present.   5. The inferior vena cava is normal in size with greater than 50%  respiratory variability, suggesting right atrial pressure of 3 mmHg.   Risk Assessment/Calculations:    CHA2DS2-VASc Score = 0   This indicates a 0.2% annual risk of stroke. The patient's score is based upon: CHF History: 0 HTN History: 0 Diabetes History: 0 Stroke History: 0 Vascular Disease History: 0 Age Score: 0 Gender Score: 0              Physical Exam:   VS:  BP 130/74   Pulse 63   Ht 6' 1 (1.854 m)   Wt 214 lb 12.8 oz (97.4 kg)   SpO2 96%   BMI 28.34 kg/m    Wt Readings from Last 3 Encounters:  01/07/24 214 lb 12.8 oz (97.4 kg)  08/26/23 214 lb 11.2 oz (97.4 kg)  08/09/23 212 lb (96.2 kg)    GEN: Well nourished, well developed in no acute distress NECK: No JVD; No carotid bruits CARDIAC: RRR, no murmurs, rubs, gallops RESPIRATORY:  Clear to auscultation without rales, wheezing or rhonchi  ABDOMEN: Soft, non-tender, non-distended EXTREMITIES:  No edema; No acute deformity       Assessment and Plan:  Paroxysmal atrial fibrillation H/o PAF in 2015 in the setting of alcohol use, converted on IV diltiazem   First reoccurrence 08/09/2023 where he was given oral flecainide  and IV diltiazem  and converted to NSR 2 episodes of A-fib in the past 10 years Follow-up 14-day ZIO 08/2023 with no atrial fibrillation detected Echocardiogram 10/2023 with normal LVEF and no significant valvular abnormalities - He has been very symptomatic during times in atrial fibrillation - Today he is without symptoms concerning for recurrent atrial fibrillation - He is maintaining SR per auscultation today - We have identified and discussed his potential triggers - Discussed Oura ring, Apple watch, or Kardia device to monitor for arrhythmias at home - CHA2DS2-VASc Score = 0.  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  Signed, Lum LITTIE Louis, NP

## 2024-01-07 ENCOUNTER — Ambulatory Visit: Payer: Self-pay | Attending: Emergency Medicine | Admitting: Emergency Medicine

## 2024-01-07 ENCOUNTER — Encounter: Payer: Self-pay | Admitting: Emergency Medicine

## 2024-01-07 VITALS — BP 130/74 | HR 63 | Ht 73.0 in | Wt 214.8 lb

## 2024-01-07 DIAGNOSIS — R9431 Abnormal electrocardiogram [ECG] [EKG]: Secondary | ICD-10-CM

## 2024-01-07 DIAGNOSIS — I480 Paroxysmal atrial fibrillation: Secondary | ICD-10-CM

## 2024-01-07 NOTE — Patient Instructions (Addendum)
 Medication Instructions:  NO CHANGES  Lab Work: NONE TO BE DONE TODAY.  Testing/Procedures: NONE  Follow-Up: At [facility], you and your health needs are our priority.  As part of our continuing mission to provide you with exceptional heart care, our providers are all part of one team.  This team includes your primary Cardiologist (physician) and Advanced Practice Providers or APPs (Physician Assistants and Nurse Practitioners) who all work together to provide you with the care you need, when you need it.  Your next appointment:   6 MONTHS  Provider:   Shelda Bruckner, MD OR Lum Louis, NP

## 2024-01-08 ENCOUNTER — Telehealth: Payer: Self-pay | Admitting: Licensed Clinical Social Worker

## 2024-01-08 NOTE — Telephone Encounter (Signed)
 H&V Care Navigation CSW Progress Note  Clinical Social Worker contacted patient by phone to f/u on self pay status and offer resources for assistance if needed. No answer today at [phone].  Patient is participating in a Managed Medicaid Plan:  No, self pay only  SDOH Screenings   Tobacco Use: Low Risk  (01/07/2024)    Marit Lark, MSW, LCSW Clinical Social Worker II [facility] Heart/Vascular Care Navigation  [phone]- work cell phone (preferred)

## 2024-01-14 ENCOUNTER — Telehealth: Payer: Self-pay | Admitting: Licensed Clinical Social Worker

## 2024-01-14 NOTE — Telephone Encounter (Signed)
 H&V Care Navigation CSW Progress Note  Clinical Social Worker contacted patient by phone to f/u on self pay status and offer resources for assistance if needed. No answer again today at [phone], left voicemail.    Patient is participating in a Managed Medicaid Plan:  No, self pay only  SDOH Screenings   Tobacco Use: Low Risk  (01/07/2024)    Marit Lark, MSW, LCSW Clinical Social Worker II [facility] Heart/Vascular Care Navigation  [phone]- work cell phone (preferred)

## 2024-03-11 ENCOUNTER — Emergency Department (HOSPITAL_COMMUNITY): Payer: Self-pay

## 2024-03-11 ENCOUNTER — Other Ambulatory Visit: Payer: Self-pay

## 2024-03-11 ENCOUNTER — Emergency Department (HOSPITAL_COMMUNITY)
Admission: EM | Admit: 2024-03-11 | Discharge: 2024-03-12 | Disposition: A | Discharge status: Home / Self Care | Payer: Self-pay | Attending: Emergency Medicine | Admitting: Emergency Medicine

## 2024-03-11 DIAGNOSIS — R002 Palpitations: Secondary | ICD-10-CM | POA: Insufficient documentation

## 2024-03-11 DIAGNOSIS — D72829 Elevated white blood cell count, unspecified: Secondary | ICD-10-CM | POA: Insufficient documentation

## 2024-03-11 MED ORDER — SODIUM CHLORIDE 0.9 % IV BOLUS
1000.0000 mL | Freq: Once | INTRAVENOUS | Status: AC
  Administered 2024-03-12: 1000 mL via INTRAVENOUS

## 2024-03-11 MED ORDER — SODIUM CHLORIDE 0.9 % IV BOLUS: 500.0000 mL | Freq: Once | INTRAVENOUS | Status: DC

## 2024-03-11 NOTE — ED Provider Notes (Incomplete)
  [place] EMERGENCY DEPARTMENT AT [facility] Provider Note   CSN: [id number] Arrival date & time: 03/11/24  2326     Patient presents with: Palpitations   Luke Benitez is a 63 y.o. male with history of paroxysmal atrial fibrillation not taking anticoagulation or blood thinners.  Presents to ED complaining of palpitations.  States that this evening he went to the gym, went to the sauna.  Reports he arrived home and began to prepare dinner and began to have palpitations in his chest.  He denies chest pain, shortness of breath.  Reports a history of atrial fibrillation.  States he does not take blood thinners, recently saw his cardiologist in September.  Reports that he has been sick with sinus infection over the last 1 week and he has been taking Augmentin.  He reports that he had a sensation of palpitations in his chest 3 days ago and went to see his PCP about this who put him on cardiac monitor and determined that patient most likely was suffering from premature ventricular contractions.  Reports that this evening he went to the gym and had a light weight workout followed by a session in the sauna.  States he went home and then began to experience palpitations.  He denies lightheadedness, dizziness, weakness.  He denies chest pain or shortness of breath.  Denies any abdominal pain, nausea or vomiting.  Denies fevers.  He does endorse diarrhea but reports this is most likely attributed to his Augmentin.  Denies any blood in stool.  Denies leg swelling.  No history of PE.   Palpitations      Prior to Admission medications   Not on File    Allergies: Patient has no known allergies.    Review of Systems  Cardiovascular:  Positive for palpitations.    Updated Vital Signs BP (!) 163/91   Pulse 100   Temp 98 F (36.7 C) (Oral)   Resp 18   Ht 6' 1 (1.854 m)   Wt 97.5 kg   SpO2 97%   BMI 28.37 kg/m   Physical Exam  (all labs ordered are listed, but only abnormal  results are displayed) Labs Reviewed  BASIC METABOLIC PANEL WITH GFR  CBC  D-DIMER, QUANTITATIVE  TROPONIN I (HIGH SENSITIVITY)    EKG: None  Radiology: No results found.  {Document cardiac monitor, telemetry assessment procedure when appropriate:32947} Procedures   Medications Ordered in the ED  sodium chloride  0.9 % bolus 1,000 mL (has no administration in time range)      {Click here for ABCD2, HEART and other calculators REFRESH Note before signing:1}                              Medical Decision Making Amount and/or Complexity of Data Reviewed Labs: ordered. Radiology: ordered.   ***  {Document critical care time when appropriate  Document review of labs and clinical decision tools ie CHADS2VASC2, etc  Document your independent review of radiology images and any outside records  Document your discussion with family members, caretakers and with consultants  Document social determinants of health affecting pt's care  Document your decision making why or why not admission, treatments were needed:32947:::1}   Final diagnoses:  None    ED Discharge Orders     None

## 2024-03-11 NOTE — ED Triage Notes (Signed)
 Pt BIB [facility] from home. Pt called out for heart palpations; denies SOB and CP.  Pt also reports being sick x2 weeks and taking abx, and minor palpations after starting the abx but tonight they got worse.

## 2024-03-11 NOTE — ED Provider Notes (Signed)
 [place] EMERGENCY DEPARTMENT AT [facility] Provider Note   CSN: [id number] Arrival date & time: 03/11/24  2326     Patient presents with: Palpitations   Luke Benitez is a 63 y.o. male with history of paroxysmal atrial fibrillation not taking anticoagulation or blood thinners.  Presents to ED complaining of palpitations.  States that this evening he went to the gym, went to the sauna.  Reports he arrived home and began to prepare dinner and began to have palpitations in his chest.  He denies chest pain, shortness of breath.  Reports a history of atrial fibrillation.  States he does not take blood thinners, recently saw his cardiologist in September.  Reports that he has been sick with sinus infection over the last 1 week and he has been taking Augmentin.  He reports that he had a sensation of palpitations in his chest 3 days ago and went to see his PCP about this who put him on cardiac monitor and determined that patient most likely was suffering from premature ventricular contractions.  Reports that this evening he went to the gym and had a light weight workout followed by a session in the sauna.  States he went home and then began to experience palpitations.  He denies lightheadedness, dizziness, weakness.  He denies chest pain or shortness of breath.  Denies any abdominal pain, nausea or vomiting.  Denies fevers.  He does endorse diarrhea but reports this is most likely attributed to his Augmentin.  Denies any blood in stool.  Denies leg swelling.  No history of PE.   Palpitations      Prior to Admission medications   Not on File    Allergies: Patient has no known allergies.    Review of Systems  Cardiovascular:  Positive for palpitations.  All other systems reviewed and are negative.   Updated Vital Signs BP (!) 163/91   Pulse 100   Temp 98 F (36.7 C) (Oral)   Resp 18   Ht 6' 1 (1.854 m)   Wt 97.5 kg   SpO2 97%   BMI 28.37 kg/m   Physical  Exam Vitals and nursing note reviewed.  Constitutional:      General: He is not in acute distress.    Appearance: He is well-developed.  HENT:     Head: Normocephalic and atraumatic.  Eyes:     Conjunctiva/sclera: Conjunctivae normal.  Cardiovascular:     Rate and Rhythm: Normal rate and regular rhythm.     Heart sounds: No murmur heard. Pulmonary:     Effort: Pulmonary effort is normal. No respiratory distress.     Breath sounds: Normal breath sounds.  Abdominal:     Palpations: Abdomen is soft.     Tenderness: There is no abdominal tenderness.  Musculoskeletal:        General: No swelling.     Cervical back: Neck supple.     Right lower leg: No edema.     Left lower leg: No edema.  Skin:    General: Skin is warm and dry.     Capillary Refill: Capillary refill takes less than 2 seconds.  Neurological:     Mental Status: He is alert.  Psychiatric:        Mood and Affect: Mood normal.     (all labs ordered are listed, but only abnormal results are displayed) Labs Reviewed  BASIC METABOLIC PANEL WITH GFR - Abnormal; Notable for the following components:      Result  Value   Glucose, Bld 124 (*)    BUN 26 (*)    Creatinine, Ser 1.42 (*)    GFR, Estimated 55 (*)    All other components within normal limits  CBC - Abnormal; Notable for the following components:   WBC 11.1 (*)    All other components within normal limits  D-DIMER, QUANTITATIVE  TROPONIN I (HIGH SENSITIVITY)  TROPONIN I (HIGH SENSITIVITY)    EKG: None  Radiology: DG Chest Port 1 View Result Date: 03/12/2024 EXAM: 1 VIEW(S) XRAY OF THE CHEST 03/12/2024 12:04:43 AM COMPARISON: 08/09/2023 CLINICAL HISTORY: cp, palpitations FINDINGS: LUNGS AND PLEURA: Lower lung volumes. No focal pulmonary opacity. No pleural effusion. No pneumothorax. HEART AND MEDIASTINUM: No acute abnormality of the cardiac and mediastinal silhouettes. BONES AND SOFT TISSUES: No acute osseous abnormality. IMPRESSION: 1. No acute  cardiopulmonary process. Electronically signed by: Morgane Naveau MD 03/12/2024 12:08 AM EST RP Workstation: [id number]    Procedures   Medications Ordered in the ED  sodium chloride 0.9 % bolus 1,000 mL (0 mLs Intravenous Stopped 03/12/24 0255)   Medical Decision Making Amount and/or Complexity of Data Reviewed Labs: ordered. Radiology: ordered.   63 year old male presents for evaluation of palpitations.  On exam, hemodynamically stable.  Afebrile, pulse rate 100 with normal sinus rhythm.  Lung sounds clear bilaterally, no hypoxia.  Abdomen soft and compressible.  Neuroexam at baseline.  No edema to bilateral lower extremities.  Lab work initiated in triage include CBC, BMP, D-dimer, troponin x 2, EKG and chest x-ray.  Patient given 1 L of fluid.  CBC with leukocytosis to 11.1, no anemia.  Metabolic panel with BUN 26, creatinine 1.42, GFR 55.  Anion gap 9.  Electrolytes grossly unremarkable.  Patient given liter of fluid for slight elevated creatinine.  No nausea or vomiting here.  D-dimer is negative so doubt PE.  Troponin flat, 4 with delta 4.  Chest x-ray unremarkable.  EKG is nonischemic with a normal sinus rhythm.  At this time patient workup is unremarkable.  Patient advised to follow-up outpatient with his cardiology group and he voiced understanding.  He was given return precautions and he voiced understanding.  He is stable to discharge home at this time.    Final diagnoses:  Palpitations    ED Discharge Orders     None          Ruthell Lonni FALCON, PA-C 03/12/24 0300    Haze Lonni PARAS, MD 03/12/24 [phone]

## 2024-03-12 LAB — D-DIMER, QUANTITATIVE: D-Dimer, Quant: 0.41 ug{FEU}/mL (ref 0.00–0.50)

## 2024-03-12 LAB — CBC
HCT: 43.7 % (ref 39.0–52.0)
Hemoglobin: 14.6 g/dL (ref 13.0–17.0)
MCH: 29.9 pg (ref 26.0–34.0)
MCHC: 33.4 g/dL (ref 30.0–36.0)
MCV: 89.4 fL (ref 80.0–100.0)
Platelets: 332 K/uL (ref 150–400)
RBC: 4.89 MIL/uL (ref 4.22–5.81)
RDW: 13 % (ref 11.5–15.5)
WBC: 11.1 K/uL — ABNORMAL HIGH (ref 4.0–10.5)
nRBC: 0 % (ref 0.0–0.2)

## 2024-03-12 LAB — BASIC METABOLIC PANEL WITH GFR
Anion gap: 9 (ref 5–15)
BUN: 26 mg/dL — ABNORMAL HIGH (ref 8–23)
CO2: 25 mmol/L (ref 22–32)
Calcium: 9.4 mg/dL (ref 8.9–10.3)
Chloride: 104 mmol/L (ref 98–111)
Creatinine, Ser: 1.42 mg/dL — ABNORMAL HIGH (ref 0.61–1.24)
GFR, Estimated: 55 mL/min — ABNORMAL LOW (ref >60)
Glucose, Bld: 124 mg/dL — ABNORMAL HIGH (ref 70–99)
Potassium: 4.4 mmol/L (ref 3.5–5.1)
Sodium: 138 mmol/L (ref 135–145)

## 2024-03-12 LAB — TROPONIN I (HIGH SENSITIVITY)
Troponin I (High Sensitivity): 4 ng/L (ref <18)
Troponin I (High Sensitivity): 4 ng/L (ref <18)

## 2024-03-12 NOTE — Discharge Instructions (Signed)
 As discussed, your work up here appears reassuring.  Please follow-up with your cardiology team for further care.  Please return to the ED with new symptoms.

## 2024-05-04 ENCOUNTER — Other Ambulatory Visit: Payer: Self-pay

## 2024-05-04 ENCOUNTER — Encounter: Payer: Self-pay | Admitting: Emergency Medicine

## 2024-05-04 DIAGNOSIS — I498 Other specified cardiac arrhythmias: Secondary | ICD-10-CM | POA: Insufficient documentation

## 2024-05-04 DIAGNOSIS — I482 Chronic atrial fibrillation, unspecified: Secondary | ICD-10-CM | POA: Insufficient documentation

## 2024-05-04 LAB — CBC
HCT: 43.6 % (ref 39.0–52.0)
Hemoglobin: 14.3 g/dL (ref 13.0–17.0)
MCH: 29.4 pg (ref 26.0–34.0)
MCHC: 32.8 g/dL (ref 30.0–36.0)
MCV: 89.5 fL (ref 80.0–100.0)
Platelets: 259 K/uL (ref 150–400)
RBC: 4.87 MIL/uL (ref 4.22–5.81)
RDW: 13.6 % (ref 11.5–15.5)
WBC: 8.6 K/uL (ref 4.0–10.5)
nRBC: 0 % (ref 0.0–0.2)

## 2024-05-04 NOTE — ED Triage Notes (Addendum)
 Pt arrives via EMS from home for onset palpitations that began at 9pm; denies any accomp symptoms; ambulatory to triage without difficulty or distress noted

## 2024-05-05 ENCOUNTER — Emergency Department
Admission: EM | Admit: 2024-05-05 | Discharge: 2024-05-05 | Disposition: A | Payer: Self-pay | Attending: Emergency Medicine | Admitting: Emergency Medicine

## 2024-05-05 DIAGNOSIS — R002 Palpitations: Secondary | ICD-10-CM

## 2024-05-05 DIAGNOSIS — I498 Other specified cardiac arrhythmias: Secondary | ICD-10-CM

## 2024-05-05 LAB — BASIC METABOLIC PANEL WITH GFR
Anion gap: 10 (ref 5–15)
BUN: 29 mg/dL — ABNORMAL HIGH (ref 8–23)
CO2: 24 mmol/L (ref 22–32)
Calcium: 9.2 mg/dL (ref 8.9–10.3)
Chloride: 104 mmol/L (ref 98–111)
Creatinine, Ser: 1.1 mg/dL (ref 0.61–1.24)
GFR, Estimated: >60 mL/min
Glucose, Bld: 109 mg/dL — ABNORMAL HIGH (ref 70–99)
Potassium: 4 mmol/L (ref 3.5–5.1)
Sodium: 138 mmol/L (ref 135–145)

## 2024-05-05 LAB — TROPONIN T, HIGH SENSITIVITY
Troponin T High Sensitivity: <15 ng/L (ref 0–19)
Troponin T High Sensitivity: <15 ng/L (ref 0–19)

## 2024-05-05 LAB — TSH: TSH: 2.97 u[IU]/mL (ref 0.350–4.500)

## 2024-05-05 LAB — MAGNESIUM: Magnesium: 2.2 mg/dL (ref 1.7–2.4)

## 2024-05-05 MED ORDER — METOPROLOL TARTRATE 25 MG PO TABS
25.0000 mg | ORAL_TABLET | Freq: Once | ORAL | Status: DC
  Filled 2024-05-05: qty 1

## 2024-05-05 NOTE — ED Notes (Signed)
 Reviewed D/C information with the patient, pt verbalized understanding. No additional concerns at this time.

## 2024-05-05 NOTE — ED Provider Notes (Signed)
 "  [facility] Provider Note    Event Date/Time   First MD Initiated Contact with Patient 05/05/24 [phone]     (approximate)   History   Palpitations   HPI  Luke Benitez is a 64 y.o. male with history of paroxysmal atrial fibrillation who presents to the emergency department with palpitations started around 9:20 PM while at rest.  Symptoms worse with trying to lie flat and at night.  They improve when he is standing up.  States he was able to exercise over the past couple days without symptoms.  No chest pain, shortness of breath.  He reports he has cut alcohol out and cut down on caffeine.  He is on metoprolol  12.5 mg twice daily.  Reports compliance.  Denies any calf tenderness or calf swelling.   History provided by patient.    Past Medical History:  Diagnosis Date   Allergy    PAF (paroxysmal atrial fibrillation) (HCC)     History reviewed. No pertinent surgical history.  MEDICATIONS:  Prior to Admission medications  Not on File    Physical Exam   Triage Vital Signs: ED Triage Vitals  Encounter Vitals Group     BP 05/04/24 2312 (!) 146/93     Girls Systolic BP Percentile --      Girls Diastolic BP Percentile --      Boys Systolic BP Percentile --      Boys Diastolic BP Percentile --      Pulse Rate 05/04/24 2312 70     Resp 05/04/24 2312 18     Temp 05/04/24 2312 98.1 F (36.7 C)     Temp Source 05/04/24 2312 Oral     SpO2 05/04/24 2312 97 %     Weight 05/04/24 2307 206 lb (93.4 kg)     Height 05/04/24 2307 6' 1 (1.854 m)     Head Circumference --      Peak Flow --      Pain Score 05/04/24 2307 0     Pain Loc --      Pain Education --      Exclude from Growth Chart --     Most recent vital signs: Vitals:   05/05/24 0125 05/05/24 0448  BP: 126/85 122/88  Pulse: 68 69  Resp: 18 18  Temp: 97.9 F (36.6 C) 98 F (36.7 C)  SpO2: 97% 100%    CONSTITUTIONAL: Alert, responds appropriately to questions. Well-appearing;  well-nourished, anxious HEAD: Normocephalic, atraumatic EYES: Conjunctivae clear, pupils appear equal, sclera nonicteric ENT: normal nose; moist mucous membranes NECK: Supple, normal ROM CARD: RRR; S1 and S2 appreciated RESP: Normal chest excursion without splinting or tachypnea; breath sounds clear and equal bilaterally; no wheezes, no rhonchi, no rales, no hypoxia or respiratory distress, speaking full sentences ABD/GI: Non-distended; soft, non-tender, no rebound, no guarding, no peritoneal signs BACK: The back appears normal EXT: Normal ROM in all joints; no deformity noted, no edema, no calf tenderness or calf swelling SKIN: Normal color for age and race; warm; no rash on exposed skin NEURO: Moves all extremities equally, normal speech PSYCH: The patient's mood and manner are appropriate.   ED Results / Procedures / Treatments   LABS: (all labs ordered are listed, but only abnormal results are displayed) Labs Reviewed  BASIC METABOLIC PANEL WITH GFR - Abnormal; Notable for the following components:      Result Value   Glucose, Bld 109 (*)    BUN 29 (*)    All  other components within normal limits  CBC  MAGNESIUM  TSH  TROPONIN T, HIGH SENSITIVITY  TROPONIN T, HIGH SENSITIVITY     EKG:  EKG Interpretation Date/Time:  Monday May 04 2024 23:08:20 EST Ventricular Rate:  81 PR Interval:  192 QRS Duration:  110 QT Interval:  370 QTC Calculation: 429 R Axis:   -55  Text Interpretation: Sinus rhythm with marked sinus arrhythmia Incomplete right bundle branch block Left anterior fascicular block Minimal voltage criteria for LVH, may be normal variant ( Cornell product ) Septal infarct , age undetermined Abnormal ECG When compared with ECG of 11-Mar-2024 23:36, PREVIOUS ECG IS PRESENT Confirmed by Neomi Neptune [phone]) on 05/05/2024 3:23:00 AM         RADIOLOGY: My personal review and interpretation of imaging:    I have personally reviewed all radiology reports.    No results found.   PROCEDURES:  Critical Care performed: No     Procedures    IMPRESSION / MDM / ASSESSMENT AND PLAN / ED COURSE  I reviewed the triage vital signs and the nursing notes.    Patient here with palpitations.  Has a cardiologist and is on metoprolol .     DIFFERENTIAL DIAGNOSIS (includes but not limited to):   Sinus arrhythmia, proximal atrial fibrillation, PVCs, PACs, electrolyte derangement, anemia, thyroid  dysfunction, dehydration, anxiety, doubt ACS, PE, CHF   Patient's presentation is most consistent with acute presentation with potential threat to life or bodily function.   PLAN: EKG shows sinus arrhythmia.  This may be what is causing patient's symptoms today.  He seems to be very sensitive to any changes in his heart rate and this seems to cause significant amount of health-related anxiety.  He has a right bundle branch block but no new ischemic change or interval abnormality.  Troponin x 2 was negative.  Hemoglobin today is normal.  Electrolytes normal.  TSH normal.  He has no risk factors for PE other than age.  No chest pain or shortness of breath.  I do not think this is a PE today.  Have offered to give him an additional dose of metoprolol  and recommended that he increase his metoprolol  from 12.5 mg twice daily to 25 mg twice daily and follow-up closely with his cardiologist.  Patient initially agreeable to this plan but then became anxious that his atrial fibrillation could be vagal induced and that beta-blockers could make his symptoms worse.  We will hold off on giving additional metoprolol  here as he states he is feeling better and he will call his cardiologist in the morning for close outpatient follow-up.  He states he is wondering if something like flecainide  may be better for him.  Given no sign of any life-threatening process present currently, I feel he is safe to follow-up with his cardiologist and PCP as an outpatient.   MEDICATIONS  GIVEN IN ED: Medications  metoprolol  tartrate (LOPRESSOR ) tablet 25 mg (25 mg Oral Patient Refused/Not Given 05/05/24 0447)     ED COURSE:  At this time, I do not feel there is any life-threatening condition present. I reviewed all nursing notes, vitals, pertinent previous records.  All lab and urine results, EKGs, imaging ordered have been independently reviewed and interpreted by myself.  I reviewed all available radiology reports from any imaging ordered this visit.  Based on my assessment, I feel the patient is safe to be discharged home without further emergent workup and can continue workup as an outpatient as needed. Discussed all findings, treatment  plan as well as usual and customary return precautions.  They verbalize understanding and are comfortable with this plan.  Outpatient follow-up has been provided as needed.  All questions have been answered.    CONSULTS:  none   OUTSIDE RECORDS REVIEWED: Reviewed recent cardiology notes.       FINAL CLINICAL IMPRESSION(S) / ED DIAGNOSES   Final diagnoses:  Palpitations  Sinus arrhythmia     Rx / DC Orders   ED Discharge Orders     None        Note:  This document was prepared using Dragon voice recognition software and may include unintentional dictation errors.   Antawn Sison, Josette SAILOR, DO 05/05/24 0502  "

## 2024-05-05 NOTE — Discharge Instructions (Addendum)
 I recommend increasing your metoprolol  to 1 full tablet (25 mg) twice daily.  I recommend increasing your water intake, avoiding caffeine is much as possible, getting at least 8 hours of sleep at night.  I recommend close follow-up with your cardiologist and your primary care doctor to discuss a sleep study to rule out sleep apnea.  If you begin having chest tightness, shortness of breath, heart rate greater than 120 bpm that is sustained, feel like you are going to pass out, please return to the emergency department.
